# Patient Record
Sex: Female | Born: 1950 | Race: White | Hispanic: No | Smoking: Former smoker
Health system: Southern US, Community
[De-identification: ages and names within clinical notes are randomized; demographics above are authoritative.]

## PROBLEM LIST (undated history)

## (undated) DIAGNOSIS — Z8489 Family history of other specified conditions: Secondary | ICD-10-CM

## (undated) DIAGNOSIS — R351 Nocturia: Secondary | ICD-10-CM

## (undated) DIAGNOSIS — I10 Essential (primary) hypertension: Secondary | ICD-10-CM

## (undated) DIAGNOSIS — K5792 Diverticulitis of intestine, part unspecified, without perforation or abscess without bleeding: Secondary | ICD-10-CM

## (undated) DIAGNOSIS — F419 Anxiety disorder, unspecified: Secondary | ICD-10-CM

## (undated) DIAGNOSIS — D649 Anemia, unspecified: Secondary | ICD-10-CM

## (undated) HISTORY — PX: TUBAL LIGATION: SHX77

## (undated) HISTORY — PX: OTHER SURGICAL HISTORY: SHX169

## (undated) HISTORY — PX: TONSILLECTOMY: SUR1361

## (undated) HISTORY — PX: ABDOMINAL HYSTERECTOMY: SHX81

---

## 1997-04-06 HISTORY — PX: BRAIN AVM REPAIR: SHX202

## 2015-08-05 DIAGNOSIS — K5792 Diverticulitis of intestine, part unspecified, without perforation or abscess without bleeding: Secondary | ICD-10-CM

## 2015-08-05 HISTORY — DX: Diverticulitis of intestine, part unspecified, without perforation or abscess without bleeding: K57.92

## 2015-10-30 ENCOUNTER — Other Ambulatory Visit: Payer: Self-pay | Admitting: General Surgery

## 2015-10-30 DIAGNOSIS — R1904 Left lower quadrant abdominal swelling, mass and lump: Secondary | ICD-10-CM

## 2015-11-01 ENCOUNTER — Ambulatory Visit
Admission: RE | Admit: 2015-11-01 | Discharge: 2015-11-01 | Disposition: A | Discharge status: Home / Self Care | Payer: Self-pay | Source: Ambulatory Visit | Attending: General Surgery | Admitting: General Surgery

## 2015-11-01 ENCOUNTER — Encounter: Payer: Self-pay | Admitting: Radiology

## 2015-11-01 DIAGNOSIS — R1904 Left lower quadrant abdominal swelling, mass and lump: Secondary | ICD-10-CM

## 2015-11-01 MED ORDER — IOPAMIDOL (ISOVUE-300) INJECTION 61%
100.0000 mL | Freq: Once | INTRAVENOUS | Status: AC | PRN
  Administered 2015-11-01: 100 mL via INTRAVENOUS

## 2015-11-03 ENCOUNTER — Inpatient Hospital Stay (HOSPITAL_COMMUNITY)
Admission: AD | Admit: 2015-11-03 | Discharge: 2015-11-06 | DRG: 392 | Disposition: A | Discharge status: Home / Self Care | Payer: BLUE CROSS/BLUE SHIELD | Source: Ambulatory Visit | Attending: General Surgery | Admitting: General Surgery

## 2015-11-03 DIAGNOSIS — N823 Fistula of vagina to large intestine: Secondary | ICD-10-CM | POA: Diagnosis present

## 2015-11-03 DIAGNOSIS — IMO0002 Reserved for concepts with insufficient information to code with codable children: Secondary | ICD-10-CM

## 2015-11-03 DIAGNOSIS — K5780 Diverticulitis of intestine, part unspecified, with perforation and abscess without bleeding: Secondary | ICD-10-CM | POA: Diagnosis present

## 2015-11-03 DIAGNOSIS — R109 Unspecified abdominal pain: Secondary | ICD-10-CM | POA: Diagnosis present

## 2015-11-03 DIAGNOSIS — K651 Peritoneal abscess: Secondary | ICD-10-CM

## 2015-11-03 LAB — COMPREHENSIVE METABOLIC PANEL
ALT: 10 U/L — ABNORMAL LOW (ref 14–54)
AST: 16 U/L (ref 15–41)
Albumin: 3.3 g/dL — ABNORMAL LOW (ref 3.5–5.0)
Alkaline Phosphatase: 69 U/L (ref 38–126)
Anion gap: 10 (ref 5–15)
BILIRUBIN TOTAL: 0.5 mg/dL (ref 0.3–1.2)
BUN: 9 mg/dL (ref 6–20)
CO2: 26 mmol/L (ref 22–32)
CREATININE: 0.56 mg/dL (ref 0.44–1.00)
Calcium: 9.1 mg/dL (ref 8.9–10.3)
Chloride: 99 mmol/L — ABNORMAL LOW (ref 101–111)
Glucose, Bld: 138 mg/dL — ABNORMAL HIGH (ref 65–99)
POTASSIUM: 3.4 mmol/L — AB (ref 3.5–5.1)
Sodium: 135 mmol/L (ref 135–145)
TOTAL PROTEIN: 8.1 g/dL (ref 6.5–8.1)

## 2015-11-03 LAB — CBC WITH DIFFERENTIAL/PLATELET
BASOS ABS: 0 10*3/uL (ref 0.0–0.1)
Basophils Relative: 0 %
Eosinophils Absolute: 0 10*3/uL (ref 0.0–0.7)
Eosinophils Relative: 1 %
HEMATOCRIT: 32.5 % — AB (ref 36.0–46.0)
Hemoglobin: 10.5 g/dL — ABNORMAL LOW (ref 12.0–15.0)
LYMPHS ABS: 1.2 10*3/uL (ref 0.7–4.0)
LYMPHS PCT: 15 %
MCH: 31.8 pg (ref 26.0–34.0)
MCHC: 32.3 g/dL (ref 30.0–36.0)
MCV: 98.5 fL (ref 78.0–100.0)
MONO ABS: 0.6 10*3/uL (ref 0.1–1.0)
MONOS PCT: 8 %
NEUTROS ABS: 6.4 10*3/uL (ref 1.7–7.7)
Neutrophils Relative %: 76 %
Platelets: 595 10*3/uL — ABNORMAL HIGH (ref 150–400)
RBC: 3.3 MIL/uL — ABNORMAL LOW (ref 3.87–5.11)
RDW: 13.6 % (ref 11.5–15.5)
WBC: 8.2 10*3/uL (ref 4.0–10.5)

## 2015-11-03 LAB — PROTIME-INR
INR: 1.18
PROTHROMBIN TIME: 15.1 s (ref 11.4–15.2)

## 2015-11-03 MED ORDER — KCL IN DEXTROSE-NACL 20-5-0.45 MEQ/L-%-% IV SOLN
INTRAVENOUS | Status: DC
  Administered 2015-11-03 – 2015-11-05 (×2): via INTRAVENOUS
  Filled 2015-11-03 (×3): qty 1000

## 2015-11-03 MED ORDER — ACETAMINOPHEN 650 MG RE SUPP: 650.0000 mg | Freq: Four times a day (QID) | RECTAL | Status: DC | PRN

## 2015-11-03 MED ORDER — IBUPROFEN 200 MG PO TABS: 600.0000 mg | ORAL_TABLET | Freq: Four times a day (QID) | ORAL | Status: DC | PRN

## 2015-11-03 MED ORDER — ENOXAPARIN SODIUM 40 MG/0.4ML ~~LOC~~ SOLN
40.0000 mg | SUBCUTANEOUS | Status: DC
  Administered 2015-11-03 – 2015-11-05 (×3): 40 mg via SUBCUTANEOUS
  Filled 2015-11-03 (×5): qty 0.4

## 2015-11-03 MED ORDER — ACETAMINOPHEN 325 MG PO TABS: 650.0000 mg | ORAL_TABLET | Freq: Four times a day (QID) | ORAL | Status: DC | PRN

## 2015-11-03 MED ORDER — ONDANSETRON 4 MG PO TBDP: 4.0000 mg | ORAL_TABLET | Freq: Four times a day (QID) | ORAL | Status: DC | PRN

## 2015-11-03 MED ORDER — OXYCODONE HCL 5 MG PO TABS
5.0000 mg | ORAL_TABLET | ORAL | Status: DC | PRN
  Administered 2015-11-04 (×2): 5 mg via ORAL
  Administered 2015-11-05 (×2): 10 mg via ORAL
  Administered 2015-11-06: 5 mg via ORAL
  Filled 2015-11-03 (×2): qty 1
  Filled 2015-11-03: qty 2
  Filled 2015-11-03 (×3): qty 1

## 2015-11-03 MED ORDER — DEXTROSE 5 % IV SOLN
2.0000 g | INTRAVENOUS | Status: DC
  Administered 2015-11-03 – 2015-11-05 (×3): 2 g via INTRAVENOUS
  Filled 2015-11-03 (×4): qty 2

## 2015-11-03 MED ORDER — METRONIDAZOLE IN NACL 5-0.79 MG/ML-% IV SOLN
500.0000 mg | Freq: Three times a day (TID) | INTRAVENOUS | Status: DC
  Administered 2015-11-03 – 2015-11-06 (×9): 500 mg via INTRAVENOUS
  Filled 2015-11-03 (×10): qty 100

## 2015-11-03 MED ORDER — MORPHINE SULFATE (PF) 2 MG/ML IV SOLN: 2.0000 mg | INTRAVENOUS | Status: DC | PRN

## 2015-11-03 MED ORDER — ENSURE ENLIVE PO LIQD
237.0000 mL | Freq: Two times a day (BID) | ORAL | Status: DC
  Administered 2015-11-05 (×2): 237 mL via ORAL

## 2015-11-03 MED ORDER — ONDANSETRON HCL 4 MG/2ML IJ SOLN: 4.0000 mg | Freq: Four times a day (QID) | INTRAMUSCULAR | Status: DC | PRN

## 2015-11-03 NOTE — H&P (Signed)
Donna Garrett is an 65 y.o. female.   Chief Complaint: abd pian HPI: Pt was seen as an outpt last week.  Her symptoms included abd pain and weight loss as well as stool per vagina.  She was noted to have a mass in LLQ as well.  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  We discussed this over the phone, and I recommended that she come in to the hospital to be evaluated for treatment in IR and antibiotics.    No past medical history on file.  No past surgical history on file.  No family history on file. Social History:  has no tobacco, alcohol, and drug history on file.  Allergies: Allergies not on file  No prescriptions prior to admission.    Results for orders placed or performed during the hospital encounter of 11/03/15 (from the past 48 hour(s))  CBC WITH DIFFERENTIAL     Status: Abnormal   Collection Time: 11/03/15  4:48 PM  Result Value Ref Range   WBC 8.2 4.0 - 10.5 K/uL   RBC 3.30 (L) 3.87 - 5.11 MIL/uL   Hemoglobin 10.5 (L) 12.0 - 15.0 g/dL   HCT 16.1 (L) 09.6 - 04.5 %   MCV 98.5 78.0 - 100.0 fL   MCH 31.8 26.0 - 34.0 pg   MCHC 32.3 30.0 - 36.0 g/dL   RDW 40.9 81.1 - 91.4 %   Platelets 595 (H) 150 - 400 K/uL   Neutrophils Relative % 76 %   Neutro Abs 6.4 1.7 - 7.7 K/uL   Lymphocytes Relative 15 %   Lymphs Abs 1.2 0.7 - 4.0 K/uL   Monocytes Relative 8 %   Monocytes Absolute 0.6 0.1 - 1.0 K/uL   Eosinophils Relative 1 %   Eosinophils Absolute 0.0 0.0 - 0.7 K/uL   Basophils Relative 0 %   Basophils Absolute 0.0 0.0 - 0.1 K/uL  Protime-INR     Status: None   Collection Time: 11/03/15  4:48 PM  Result Value Ref Range   Prothrombin Time 15.1 11.4 - 15.2 seconds   INR 1.18    No results found.  Review of Systems  Constitutional: Positive for malaise/fatigue and weight loss. Negative for chills and fever.  HENT: Negative for hearing loss.   Eyes: Negative for blurred vision and double vision.   Respiratory: Negative for cough, sputum production and shortness of breath.   Cardiovascular: Negative for chest pain and palpitations.  Gastrointestinal: Positive for abdominal pain. Negative for constipation, diarrhea, nausea and vomiting.  Genitourinary: Negative for dysuria, frequency and urgency.  Skin: Negative for itching and rash.  Neurological: Positive for weakness. Negative for dizziness and headaches.    Blood pressure 119/62, pulse 94, temperature 98.6 F (37 C), temperature source Oral, resp. rate 18, SpO2 100 %. Physical Exam  Constitutional: She is oriented to person, place, and time. She appears well-developed and well-nourished. No distress.  HENT:  Head: Normocephalic and atraumatic.  Eyes: Conjunctivae and EOM are normal. Pupils are equal, round, and reactive to light.  Neck: Normal range of motion.  Cardiovascular: Normal rate and regular rhythm.   Respiratory: Effort normal and breath sounds normal. No respiratory distress.  GI: Soft. She exhibits mass (LLQ). She exhibits no distension. There is tenderness. There is no rebound and no guarding.  Musculoskeletal: Normal range of motion.  Neurological: She is alert and oriented to person, place, and time.  Skin: Skin is warm and dry.  She is not diaphoretic.     Assessment/Plan 65 y.o. F with chronic appearing abdominal abscesses on CT.  This appears to be due to diverticular disease.  I would like to have IR evaluate for possible drain placement.  I will make NPO p MN.  Will place on IV antibiotics and get full set of labs to start her workup.   Vanita Panda., MD 11/03/2015, 5:30 PM

## 2015-11-04 ENCOUNTER — Encounter (HOSPITAL_COMMUNITY): Payer: Self-pay

## 2015-11-04 ENCOUNTER — Inpatient Hospital Stay (HOSPITAL_COMMUNITY): Payer: BLUE CROSS/BLUE SHIELD

## 2015-11-04 MED ORDER — MIDAZOLAM HCL 2 MG/2ML IJ SOLN
INTRAMUSCULAR | Status: AC | PRN
  Administered 2015-11-04 (×2): 0.5 mg via INTRAVENOUS
  Administered 2015-11-04: 1 mg via INTRAVENOUS

## 2015-11-04 MED ORDER — FENTANYL CITRATE (PF) 100 MCG/2ML IJ SOLN
INTRAMUSCULAR | Status: AC | PRN
  Administered 2015-11-04: 50 ug via INTRAVENOUS
  Administered 2015-11-04: 25 ug via INTRAVENOUS

## 2015-11-04 MED ORDER — FENTANYL CITRATE (PF) 100 MCG/2ML IJ SOLN
INTRAMUSCULAR | Status: AC
  Filled 2015-11-04: qty 4

## 2015-11-04 MED ORDER — MIDAZOLAM HCL 2 MG/2ML IJ SOLN
INTRAMUSCULAR | Status: AC
  Filled 2015-11-04: qty 4

## 2015-11-04 MED ORDER — ZOLPIDEM TARTRATE 5 MG PO TABS
5.0000 mg | ORAL_TABLET | Freq: Once | ORAL | Status: AC
  Administered 2015-11-04: 5 mg via ORAL
  Filled 2015-11-04: qty 1

## 2015-11-04 NOTE — Procedures (Signed)
Technically successful CT guided placed of a 12 Fr multi-side hole drainage catheter placement into the left lower abdominal quadrant yielding 80 cc of purulent, foul smelling fluid..    All aspirated samples sent to the laboratory for analysis.    EBL: Minimal  No immediate post procedural complications.   Katherina Right, MD Pager #: [phone]

## 2015-11-04 NOTE — H&P (Signed)
Chief Complaint: Patient was seen in consultation today for perc drainage of abdominal abscesses at the request of Dr. Romie Levee  Referring Physician(s): Dr. Romie Levee  Supervising Physician: Simonne Come  Patient Status: Inpatient  History of Present Illness: Donna Garrett is a 65 y.o. 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 She feels a little better on IV abx and IR is asked to eval for percutaneous drainage PMHx, meds, imaging, labs, allergies reviewed. She has been NPO  No past medical history on file.  No past surgical history on file.  Allergies: Review of patient's allergies indicates no known allergies.  Medications:  Current Facility-Administered Medications:  .  acetaminophen (TYLENOL) tablet 650 mg, 650 mg, Oral, Q6H PRN **OR** acetaminophen (TYLENOL) suppository 650 mg, 650 mg, Rectal, Q6H PRN, Romie Levee, MD .  cefTRIAXone (ROCEPHIN) 2 g in dextrose 5 % 50 mL IVPB, 2 g, Intravenous, Q24H, 2 g at 11/03/15 1804 **AND** metroNIDAZOLE (FLAGYL) IVPB 500 mg, 500 mg, Intravenous, Q8H, Romie Levee, MD, 500 mg at 11/04/15 0243 .  dextrose 5 % and 0.45 % NaCl with KCl 20 mEq/L infusion, , Intravenous, Continuous, Romie Levee, MD, Last Rate: 10 mL/hr at 11/04/15 0600 .  enoxaparin (LOVENOX) injection 40 mg, 40 mg, Subcutaneous, Q24H, Romie Levee, MD, 40 mg at 11/03/15 1850 .  feeding supplement (ENSURE ENLIVE) (ENSURE ENLIVE) liquid 237 mL, 237 mL, Oral, BID BM, Romie Levee, MD .  ibuprofen (ADVIL,MOTRIN) tablet 600 mg, 600 mg, Oral, Q6H PRN, Romie Levee, MD .  morphine 2 MG/ML injection 2-4 mg, 2-4 mg, Intravenous, Q3H PRN, Romie Levee, MD .  ondansetron (ZOFRAN-ODT) disintegrating tablet 4 mg, 4 mg, Oral, Q6H PRN **OR** ondansetron (ZOFRAN) injection 4 mg, 4 mg, Intravenous, Q6H PRN, Romie Levee, MD .  oxyCODONE (Oxy IR/ROXICODONE) immediate release tablet 5-10  mg, 5-10 mg, Oral, Q4H PRN, Romie Levee, MD, 5 mg at 11/04/15 1610    No family history on file.  Social History   Social History  . Marital status: Divorced    Spouse name: N/A  . Number of children: N/A  . Years of education: N/A   Social History Main Topics  . Smoking status: Not on file  . Smokeless tobacco: Not on file  . Alcohol use Not on file  . Drug use: Unknown  . Sexual activity: Not on file   Other Topics Concern  . Not on file   Social History Narrative  . No narrative on file     Review of Systems: A 12 point ROS discussed and pertinent positives are indicated in the HPI above.  All other systems are negative.  Review of Systems  Vital Signs: BP 95/64 (BP Location: Right Arm)   Pulse 62   Temp 99 F (37.2 C) (Oral)   Resp 16   Ht 5' 1.5" (1.562 m) Comment: per pt  Wt 130 lb (59 kg) Comment: per pt  SpO2 97%   BMI 24.17 kg/m   Physical Exam  Constitutional: She is oriented to person, place, and time. She appears well-developed and well-nourished. No distress.  HENT:  Head: Normocephalic.  Mouth/Throat: Oropharynx is clear and moist.  Neck: Normal range of motion. No JVD present. No tracheal deviation present.  Cardiovascular: Normal rate, regular rhythm and normal heart sounds.   Pulmonary/Chest: Effort normal and breath sounds normal. No respiratory distress. She has no wheezes. She has no rales.  Abdominal: Soft. She exhibits no distension. There is  tenderness in the left lower quadrant. There is no guarding.  Neurological: She is alert and oriented to person, place, and time.  Skin: Skin is warm and dry.  Psychiatric: She has a normal mood and affect. Judgment normal.    Mallampati Score:  MD Evaluation Airway: WNL Heart: WNL Abdomen: WNL Chest/ Lungs: WNL ASA  Classification: 2 Mallampati/Airway Score: One  Imaging: Ct Abdomen Pelvis W Contrast  Result Date: 11/01/2015 CLINICAL DATA:  65 year old female with abnormal weight  loss of 25 pound since May 2017. Possible left lower quadrant mass. Diarrhea. Nausea. Left lower quadrant abdominal pain. EXAM: CT ABDOMEN AND PELVIS WITH CONTRAST TECHNIQUE: Multidetector CT imaging of the abdomen and pelvis was performed using the standard protocol following bolus administration of intravenous contrast. CONTRAST:  ISOVUE-300 IOPAMIDOL (ISOVUE-300) INJECTION 61% COMPARISON:  No priors. FINDINGS: Creatinine was obtained on site at [facility] at [address] Results: Creatinine 0.5 mg/dL. Lower chest:  Unremarkable. Hepatobiliary: No cystic or solid hepatic lesions. No intra or extrahepatic biliary ductal dilatation. Gallbladder is normal in appearance. Pancreas: No pancreatic mass. No pancreatic ductal dilatation. No pancreatic or peripancreatic fluid or inflammatory changes. Spleen: Unremarkable. Adrenals/Urinary Tract: 2.8 x 2.0 cm left adrenal nodule (image 20 of series 3). Right adrenal gland is normal in appearance. Nonobstructive calculi are present within the left renal collecting system, measuring up to 3 mm in the interpolar region. Sub cm low-attenuation lesion in the interpolar region of the right kidney is too small to definitively characterize,, but statistically likely a cyst. No hydroureteronephrosis. Urinary bladder is nearly completely decompressed, but the wall appears thickened and trabeculated, and there is a small amount of gas non dependently in the anterior aspect of the urinary bladder. Stomach/Bowel: The appearance of the stomach is normal. There is no pathologic dilatation of small bowel or colon. There is extensive colonic wall thickening throughout the sigmoid colon. Some diverticulae are noted throughout this region. In addition, there is evidence of what appear to be contained perforations of the sigmoid colon throughout the anatomic pelvis. The largest of these is located in the left lower quadrant (axial image 54 of series 3 and coronal image 35  of series 601) measuring 5.0 x 6.9 x 6.6 cm. Posterior to this also in the left lower quadrant there is a 5.7 x 4.6 x 6.7 cm rim enhancing collection (axial image 56 of series 3 and coronal image 57 of series 601). Both of these collections appear to contain feculent contents, and they may communicate with one another (image 54 of series 3). Other smaller collections include a 2.8 cm low-attenuation rim enhancing collection to the left of the proximal sigmoid colon (image 62 of series 3), and a complex gas and fluid containing collection cephalad to the distal sigmoid colon (image 57 of series 3) measuring up to 2.8 cm. Importantly, there also appear to be several fistulous tracts in the low anatomic pelvis, most notable for a communication between the distal sigmoid colon and vaginal apex, which is best visualized on sagittal image 77 of series 602, and axial image 67 of series 3. Along the right lateral pelvic sidewall there is a smaller fistulous tract which may communicate with adjacent loops of small bowel, however, this is uncertain. Normal appendix. Vascular/Lymphatic: Aortic atherosclerosis, without evidence of aneurysm or dissection in the abdominal or pelvic vasculature. Multiple borderline enlarged retroperitoneal lymph nodes are nonspecific, but likely reactive. Several prominent nonenlarged mesenteric lymph nodes also noted, also likely reactive. Reproductive: Gas and  oral contrast material is noted within the vagina, compatible with colovaginal fistula. Status post hysterectomy. Ovaries are not confidently identified may be surgically absent or atrophic. Other: Trace volume of ascites.  No pneumoperitoneum. Musculoskeletal: There are no aggressive appearing lytic or blastic lesions noted in the visualized portions of the skeleton. IMPRESSION: 1. Findings, as above, compatible with complicated diverticulitis with 2 large and several smaller diverticular abscesses which appear contained within the  sigmoid mesocolon, and small fistulous tracts in the pelvis, as detailed above, including a colovaginal fistula. Given the presence of a small amount of gas in the urinary bladder, the possibility of a colovesical fistula should also be considered. Surgical consultation is recommended. 2. Extensive colonic wall thickening is noted throughout the sigmoid colon. This is favored to be inflammatory in the setting of what appears to be chronically perforated diverticular disease. Underlying neoplasm is difficult to entirely exclude, however, and attention at time of follow-up surgery or nonemergent colonoscopy is recommended to exclude underlying neoplasm. 3. Large left adrenal nodule measuring 2.8 x 2.0 cm. This is indeterminate, and warrants further evaluation with adrenal protocol CT scan in the near future. This recommendation follows ACR consensus guidelines: Management of Incidental Adrenal Masses: A White Paper of the ACR Incidental Findings Committee. J Am Coll Radiol 2017 (in press). 4. Nonobstructive calculi in the left renal collecting system measuring up to 3 mm. No ureteral stones or findings of urinary tract obstruction are noted at this time. 5. Aortic atherosclerosis. These results were called by telephone at the time of interpretation on 11/01/2015 at 2:35 pm to Dr. Romie Levee, who verbally acknowledged these results. Electronically Signed   By: Trudie Reed M.D.   On: 11/01/2015 14:38   Labs:  CBC:  Recent Labs  11/03/15 1648  WBC 8.2  HGB 10.5*  HCT 32.5*  PLT 595*    COAGS:  Recent Labs  11/03/15 1648  INR 1.18    BMP:  Recent Labs  11/03/15 1648  NA 135  K 3.4*  CL 99*  CO2 26  GLUCOSE 138*  BUN 9  CALCIUM 9.1  CREATININE 0.56  GFRNONAA >60  GFRAA >60    LIVER FUNCTION TESTS:  Recent Labs  11/03/15 1648  BILITOT 0.5  AST 16  ALT 10*  ALKPHOS 69  PROT 8.1  ALBUMIN 3.3*    TUMOR MARKERS: No results for input(s): AFPTM, CEA, CA199, CHROMGRNA  in the last 8760 hours.  Assessment and Plan: Diverticulitis with multiple abscesses and colovesical/colovaginal fistula Plan for perc drainage, most likely x 2, of intra-abdominal abscesses. Labs ok Risks and Benefits discussed with the patient including bleeding, infection, damage to adjacent structures, bowel perforation/fistula connection, and sepsis. All of the patient's questions were answered, patient is agreeable to proceed. Consent signed and in chart.    Thank you for this interesting consult.  I greatly enjoyed meeting Donna Garrett and look forward to participating in their care.  A copy of this report was sent to the requesting provider on this date.  Electronically Signed: Brayton El 11/04/2015, 9:31 AM   I spent a total of 20 minutes in face to face in clinical consultation, greater than 50% of which was counseling/coordinating care for perc drainage of abdominal abscesses

## 2015-11-04 NOTE — Progress Notes (Signed)
MEDICATION RELATED CONSULT NOTE   IR Procedure Consult - Anticoagulant/Antiplatelet PTA/Inpatient Med List Review by Pharmacist    Procedure: CT guided percutaneous drainage    Completed: 7/31 @ 1538  Post-Procedural bleeding risk per IR MD assessment:  standard  Antithrombotic medications on inpatient or PTA profile prior to procedure:     Lovenox 40mg   Recommended restart time per IR Post-Procedure Guidelines:   At least 4 hours post-procedure  Other considerations:    RN has already given Lovenox dose x1 post-procedure prior to pharmacy being alerted of this consult.  No bleeding noted.  Plan:     Continue Lovenox 40mg  Monitor for s/sx of bleeding  Junita Push, PharmD, BCPS Pager: [phone] 11/04/2015@8 :48 PM

## 2015-11-04 NOTE — Progress Notes (Signed)
Initial Nutrition Assessment  DOCUMENTATION CODES:   Not applicable  INTERVENTION:  -Continue Ensure Enlive po BID, each supplement provides 350 kcal and 20 grams of protein -RD to continue to monitor  NUTRITION DIAGNOSIS:   Inadequate oral intake related to poor appetite as evidenced by per patient/family report.  GOAL:   Patient will meet greater than or equal to 90% of their needs  MONITOR:   PO intake, I & O's, Labs, Weight trends  REASON FOR ASSESSMENT:   Malnutrition Screening Tool    ASSESSMENT:   Donna Garrett is a 65 y.o. 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 Pt states that she has lost 25# in 3 months. Stated weight loss would exhibit 25#/16% severe wt loss in 3 months. She also stated she was "very hungry" Was unable to retrieve diet hx due to pt going to radiology, will retrieve at follow-up. She does not have a weight hx in the chart. Will monitor for diet advancement and PO intake Unable to complete Nutrition-Focused physical exam at this time.   Labs and medications reviewed: K 3.4 Versed, Fentanyl D5 @ 79mL/hr --> 41 calories  Diet Order:  Diet NPO time specified  Skin:  Reviewed, no issues  Last BM:  PTA  Height:   Ht Readings from Last 1 Encounters:  11/03/15 5' 1.5" (1.562 m)    Weight:   Wt Readings from Last 1 Encounters:  11/03/15 130 lb (59 kg)    Ideal Body Weight:  48.86 kg  BMI:  Body mass index is 24.17 kg/m.  Estimated Nutritional Needs:   Kcal:  1200-1450 calories  Protein:  60-70 grams  Fluid:  >/= 1.2L  EDUCATION NEEDS:   No education needs identified at this time  Dionne Ano. Shanisha Lech, MS, RD LDN Inpatient Clinical  Dietitian Pager [phone]

## 2015-11-04 NOTE — Progress Notes (Signed)
Intra-abdominal abscess (HCC)  Subjective: Pt did well overnight  Objective: Vital signs in last 24 hours: Temp:  [98.6 F (37 C)-99 F (37.2 C)] 99 F (37.2 C) (07/31 0540) Pulse Rate:  [62-94] 62 (07/31 0540) Resp:  [16-18] 16 (07/31 0540) BP: (95-119)/(52-64) 95/64 (07/31 0540) SpO2:  [97 %-100 %] 97 % (07/31 0540) Weight:  [59 kg (130 lb)] 59 kg (130 lb) (07/30 1650)    Intake/Output from previous day: 07/30 0701 - 07/31 0700 In: 70 [I.V.:70] Out: 900 [Urine:900] Intake/Output this shift: Total I/O In: 70 [I.V.:70] Out: 900 [Urine:900]  General appearance: alert and cooperative GI: abd soft, LLQ mass the same  Lab Results:  Results for orders placed or performed during the hospital encounter of 11/03/15 (from the past 24 hour(s))  CBC WITH DIFFERENTIAL     Status: Abnormal   Collection Time: 11/03/15  4:48 PM  Result Value Ref Range   WBC 8.2 4.0 - 10.5 K/uL   RBC 3.30 (L) 3.87 - 5.11 MIL/uL   Hemoglobin 10.5 (L) 12.0 - 15.0 g/dL   HCT 15.4 (L) 00.8 - 67.6 %   MCV 98.5 78.0 - 100.0 fL   MCH 31.8 26.0 - 34.0 pg   MCHC 32.3 30.0 - 36.0 g/dL   RDW 19.5 09.3 - 26.7 %   Platelets 595 (H) 150 - 400 K/uL   Neutrophils Relative % 76 %   Neutro Abs 6.4 1.7 - 7.7 K/uL   Lymphocytes Relative 15 %   Lymphs Abs 1.2 0.7 - 4.0 K/uL   Monocytes Relative 8 %   Monocytes Absolute 0.6 0.1 - 1.0 K/uL   Eosinophils Relative 1 %   Eosinophils Absolute 0.0 0.0 - 0.7 K/uL   Basophils Relative 0 %   Basophils Absolute 0.0 0.0 - 0.1 K/uL  Comprehensive metabolic panel     Status: Abnormal   Collection Time: 11/03/15  4:48 PM  Result Value Ref Range   Sodium 135 135 - 145 mmol/L   Potassium 3.4 (L) 3.5 - 5.1 mmol/L   Chloride 99 (L) 101 - 111 mmol/L   CO2 26 22 - 32 mmol/L   Glucose, Bld 138 (H) 65 - 99 mg/dL   BUN 9 6 - 20 mg/dL   Creatinine, Ser 1.24 0.44 - 1.00 mg/dL   Calcium 9.1 8.9 - 58.0 mg/dL   Total Protein 8.1 6.5 - 8.1 g/dL   Albumin 3.3 (L) 3.5 - 5.0 g/dL   AST  16 15 - 41 U/L   ALT 10 (L) 14 - 54 U/L   Alkaline Phosphatase 69 38 - 126 U/L   Total Bilirubin 0.5 0.3 - 1.2 mg/dL   GFR calc non Af Amer >60 >60 mL/min   GFR calc Af Amer >60 >60 mL/min   Anion gap 10 5 - 15  Protime-INR     Status: None   Collection Time: 11/03/15  4:48 PM  Result Value Ref Range   Prothrombin Time 15.1 11.4 - 15.2 seconds   INR 1.18      Studies/Results Radiology     MEDS, Scheduled . cefTRIAXone (ROCEPHIN)  IV  2 g Intravenous Q24H   And  . metronidazole  500 mg Intravenous Q8H  . enoxaparin (LOVENOX) injection  40 mg Subcutaneous Q24H  . feeding supplement (ENSURE ENLIVE)  237 mL Oral BID BM     Assessment: Intra-abdominal abscess (HCC) Colovaginal fistula  Plan: Will discuss with IR today re: drainage catheter placement.  Cont IV antibiotics.  LOS: 1 day    Vanita Panda, MD [facility], [place] [phone]   11/04/2015 6:45 AM

## 2015-11-05 HISTORY — PX: OTHER SURGICAL HISTORY: SHX169

## 2015-11-05 MED ORDER — ZOLPIDEM TARTRATE 5 MG PO TABS
5.0000 mg | ORAL_TABLET | Freq: Every evening | ORAL | Status: DC | PRN
Start: 2015-11-05 — End: 2015-11-06
  Administered 2015-11-05: 5 mg via ORAL
  Filled 2015-11-05: qty 1

## 2015-11-05 NOTE — Progress Notes (Signed)
Patient ID: Donna Garrett, female   DOB: 12/25/1950, 65 y.o.   MRN: [id number]    Referring Physician(s): Thomas,A  Supervising Physician: Richarda Overlie  Patient Status:  Inpatient  Chief Complaint:  Diverticular abscess  Subjective:  Pt feeling much better following pelvic abscess drainage yesterday; anticipates d/c home tomorrow   Allergies: Review of patient's allergies indicates no known allergies.  Medications: Prior to Admission medications   Medication Sig Start Date End Date Taking? Authorizing Provider  acetaminophen (TYLENOL) 500 MG tablet Take 500-1,000 mg by mouth every 6 (six) hours as needed for mild pain or headache.   Yes Historical Provider, MD  cetirizine (ZYRTEC) 10 MG tablet Take 10 mg by mouth daily as needed for allergies.   Yes Historical Provider, MD  lisinopril (PRINIVIL,ZESTRIL) 10 MG tablet Take 10 mg by mouth daily as needed (high blood pressure).  09/13/15  Yes Historical Provider, MD  Melatonin 3 MG TABS Take 3 mg by mouth daily as needed (sleep).   Yes Historical Provider, MD     Vital Signs: BP 113/60 (BP Location: Right Arm)   Pulse 61   Temp 98.3 F (36.8 C) (Oral)   Resp 16   Ht 5' 1.5" (1.562 m) Comment: per pt  Wt 130 lb (59 kg) Comment: per pt  SpO2 100%   BMI 24.17 kg/m   Physical Exam awake/alert; LLQ drain intact, about 100 cc turbid reddish- brown fluid with air in bag; cx's pend  Imaging: Ct Image Guided Drainage By Percutaneous Catheter  Result Date: 11/04/2015 INDICATION: History of perforated diverticulitis. Please perform CT-guided drain placement for infection source control. EXAM: CT IMAGE GUIDED DRAINAGE BY PERCUTANEOUS CATHETER COMPARISON:  CT abdomen pelvis - 11/01/2015 MEDICATIONS: The patient is currently admitted to the hospital and receiving intravenous antibiotics. The antibiotics were administered within an appropriate time frame prior to the initiation of the procedure. ANESTHESIA/SEDATION: Moderate (conscious)  sedation was employed during this procedure. A total of Versed 2 mg and Fentanyl 100 mcg was administered intravenously. Moderate Sedation Time: 25 minutes. The patient's level of consciousness and vital signs were monitored continuously by radiology nursing throughout the procedure under my direct supervision. CONTRAST:  None COMPLICATIONS: None immediate. PROCEDURE: Informed written consent was obtained from the patient after a discussion of the risks, benefits and alternatives to treatment. The patient was placed supine on the CT gantry and a pre procedural CT was performed re-demonstrating the known bilobed abscess/fluid collections within the left lower abdominal quadrant with dominant the deep component measuring approximately 4.9 x 6.1 cm (image 16, series 2) and more superficial air in fluid contain though less well-defined component measuring approximately 4.4 x 2.9 cm (image 15). The procedure was planned. A timeout was performed prior to the initiation of the procedure. The skin overlying the left lower lateral abdomen was prepped and draped in the usual sterile fashion. The overlying soft tissues were anesthetized with 1% lidocaine with epinephrine. Appropriate trajectory was planned with the use of a 22 gauge spinal needle. An 18 gauge trocar needle was advanced through the superficial component of the bilobed abscess with tip terminating within the deeper component of the complex fluid collection. Appropriate positioning was confirmed and a short Amplatz super stiff wire was coiled within the collection. Appropriate positioning was confirmed with a limited CT scan. The tract was serially dilated allowing placement of a 12 French biliary drainage multipurpose catheter. Appropriate positioning was confirmed with sideholes of the drainage catheter extending from the deeper component to the peripheral  aspect of the superficial component with a limited postprocedural CT scan. Following drainage catheter  placement, approximately 80 Ml of purulent, foul smelling fluid was aspirated. The tube was connected to a drainage bag and sutured in place. A dressing was placed. The patient tolerated the procedure well without immediate post procedural complication. IMPRESSION: Successful CT guided placement of a 40 French biliary drainage drain catheter into the bilobed abscess within the left lower abdominal quadrant with sideholes extending from the deep component to the peripheral aspect of the superficial component ultimately yielding aspiration of approximately 80 mL of purulent fluid. Samples were sent to the laboratory as requested by the ordering clinical team. Electronically Signed   By: Simonne Come M.D.   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    Electronically Signed: D. Jeananne Rama 11/05/2015, 2:54 PM   I spent a total of 15 minutes at the the patient's bedside AND on the patient's hospital floor or unit, greater than 50% of which was counseling/coordinating care for pelvic abscess drain

## 2015-11-05 NOTE — Progress Notes (Signed)
Intra-abdominal abscess (HCC)  Subjective: Pt did well after CT drain yesterday.  No major events.  Starting to feel better already  Objective: Vital signs in last 24 hours: Temp:  [97.5 F (36.4 C)-99.1 F (37.3 C)] 98.7 F (37.1 C) (08/01 0630) Pulse Rate:  [56-78] 60 (08/01 0630) Resp:  [11-18] 16 (08/01 0630) BP: (84-107)/(37-67) 107/67 (08/01 0630) SpO2:  [96 %-100 %] 98 % (08/01 0630) Last BM Date: 11/04/15  Intake/Output from previous day: 07/31 0701 - 08/01 0700 In: 447.2 [P.O.:120; I.V.:227.2; IV Piggyback:100] Out: 700 [Urine:700] Intake/Output this shift: No intake/output data recorded.  General appearance: alert and cooperative GI: abd soft, LLQ mass the same  Lab Results:  Results for orders placed or performed during the hospital encounter of 11/03/15 (from the past 24 hour(s))  Aerobic/Anaerobic Culture (surgical/deep wound)     Status: None (Preliminary result)   Collection Time: 11/04/15  4:00 PM  Result Value Ref Range   Specimen Description ABSCESS DIVERTICULAR ABSCESS    Special Requests NONE    Gram Stain      ABUNDANT WBC PRESENT, PREDOMINANTLY PMN ABUNDANT GRAM POSITIVE COCCI IN PAIRS IN CHAINS RARE GRAM POSITIVE RODS Performed at [facility]    Culture PENDING    Report Status PENDING      Studies/Results Radiology     MEDS, Scheduled . cefTRIAXone (ROCEPHIN)  IV  2 g Intravenous Q24H   And  . metronidazole  500 mg Intravenous Q8H  . enoxaparin (LOVENOX) injection  40 mg Subcutaneous Q24H  . feeding supplement (ENSURE ENLIVE)  237 mL Oral BID BM     Assessment: Intra-abdominal abscess (HCC) Colovaginal fistula  Plan: Reg diet today Will switch to PO abx tom Plan for d/c tom    LOS: 2 days    Vanita Panda, MD [facility], [place] [phone]   11/05/2015 8:29 AM

## 2015-11-06 ENCOUNTER — Other Ambulatory Visit: Payer: Self-pay | Admitting: Radiology

## 2015-11-06 DIAGNOSIS — N739 Female pelvic inflammatory disease, unspecified: Secondary | ICD-10-CM

## 2015-11-06 LAB — AEROBIC/ANAEROBIC CULTURE W GRAM STAIN (SURGICAL/DEEP WOUND)

## 2015-11-06 LAB — CBC
HCT: 28.9 % — ABNORMAL LOW (ref 36.0–46.0)
HEMOGLOBIN: 9.3 g/dL — AB (ref 12.0–15.0)
MCH: 31.5 pg (ref 26.0–34.0)
MCHC: 32.2 g/dL (ref 30.0–36.0)
MCV: 98 fL (ref 78.0–100.0)
PLATELETS: 501 10*3/uL — AB (ref 150–400)
RBC: 2.95 MIL/uL — ABNORMAL LOW (ref 3.87–5.11)
RDW: 13.8 % (ref 11.5–15.5)
WBC: 6.7 10*3/uL (ref 4.0–10.5)

## 2015-11-06 LAB — AEROBIC/ANAEROBIC CULTURE (SURGICAL/DEEP WOUND)

## 2015-11-06 MED ORDER — OXYCODONE HCL 5 MG PO TABS: 5.0000 mg | ORAL_TABLET | ORAL | 0 refills | Status: DC | PRN

## 2015-11-06 MED ORDER — METRONIDAZOLE 500 MG PO TABS: 500.0000 mg | ORAL_TABLET | Freq: Three times a day (TID) | ORAL | 0 refills | Status: DC

## 2015-11-06 MED ORDER — AMOXICILLIN-POT CLAVULANATE 875-125 MG PO TABS: 1.0000 | ORAL_TABLET | Freq: Two times a day (BID) | ORAL | 0 refills | Status: DC

## 2015-11-06 NOTE — Discharge Summary (Signed)
Physician Discharge Summary  Patient ID: Donna Garrett MRN: [id number] DOB/AGE: [birth year] 64 y.o.  Admit date: 11/03/2015 Discharge date: 11/06/2015  Admission Diagnoses: Intra-abdominal abscess  Discharge Diagnoses:  Principal Problem:   Intra-abdominal abscess (HCC) Colovaginal fistula  Discharged Condition: good  Hospital Course: Patient admitted with large pericolonic abscess.  She was placed on antibiotics and IR was consulted with drain placement.  This occurred without difficulty.  She has now been stable for 24 hrs post procedure and is ready for discharge.  Consults: IR  Significant Diagnostic Studies: labs: cbc, chemistry  Treatments: CT guided drain  Discharge Exam: Blood pressure 113/71, pulse (!) 107, temperature 98.2 F (36.8 C), temperature source Oral, resp. rate 15, height 5' 1.5" (1.562 m), weight 59 kg (130 lb), SpO2 100 %. General appearance: alert and cooperative GI: soft, non-tender Drain: purulent output noted  Disposition: Final discharge disposition not confirmed    Follow-up Information    Vanita Panda., MD. Schedule an appointment as soon as possible for a visit in 2 week(s).   Specialty:  General Surgery Contact information: [address] [place] [address] [phone]           Signed: Vanita Panda 11/06/2015, 10:06 AM

## 2015-11-06 NOTE — Progress Notes (Signed)
Pt discharged to home with sister and other family member at bedside.  Vitals stable. Denies pain.  Dressing Clean dry and intact.  Discussed discharge instructions.  Verbalized understanding.  Pt provided with 2 weeks of supplies of dressing changes, flushes, and measuring cup for drain.  Pt demonstrated proper technique with flushing 5 ml saline into drain.  Pt verbalized understanding of dressing changes and stated she performed a dressing change this morning with night RN.  Pt demonstrated how to empty and measure drain amount.  No other concerns at this time.  Barrie Lyme RN 2:03 PM 11/06/2015

## 2015-11-06 NOTE — Discharge Instructions (Addendum)
Percutaneous Abscess Drain, Care After °Refer to this sheet in the next few weeks. These instructions provide you with information on caring for yourself after your procedure. Your health care provider may also give you more specific instructions. Your treatment has been planned according to current medical practices, but problems sometimes occur. Call your health care provider if you have any problems or questions after your procedure. °WHAT TO EXPECT AFTER THE PROCEDURE °After your procedure, it is typical to have the following:  °· A small amount of discomfort in the area where the drainage tube was placed. °· A small amount of bruising around the area where the drainage tube was placed. °· Sleepiness and fatigue for the rest of the day from the medicines used. °HOME CARE INSTRUCTIONS °· Rest at home for 1-2 days following your procedure or as directed by your health care provider. °· If you go home right after the procedure, plan to have someone with you for 24 hours. °· Do not take a bath or shower for 24 hours after your procedure. °· Take medicines only as directed by your health care provider. Ask your health care provider when you can resume taking any normal medicines. °· Change bandages (dressings) as directed.   °· You may be told to record the amount of drainage from the bag every time you empty it. Follow your health care provider's directions for emptying the bag. Write down the amount of drainage, the date, and the time you emptied it. °· Call your health care provider when the drain is putting out less than 10 mL of drainage per day for 2-3 days in a row or as directed by your health care provider. °· Follow your health care provider's instructions for cleaning the drainage tube. You may need to clean the tube every day so that it does not clog. °SEEK MEDICAL CARE IF: °· You have increased bleeding (more than a small spot) from the site where the drainage tube was placed. °· You have redness,  swelling, or increasing pain around the site where the drainage tube was placed. °· You notice a discharge or bad smell coming from the site where the drainage tube was placed. °· You have a fever or chills.  °· You have pain that is not helped by medicine.   °SEEK IMMEDIATE MEDICAL CARE IF: °· There is leakage around the drainage tube. °· The drainage tube pulls out. °· You suddenly stop having drainage from the tube. °· You suddenly have blood in the drainage fluid. °· You become dizzy or faint. °· You develop a rash.   °· You have nausea or vomiting. °· You have difficulty breathing, feel short of breath, or feel faint.   °· You develop chest pain. °· You have problems with your speech or vision. °· You have trouble balancing or moving your arms or legs. °  °This information is not intended to replace advice given to you by your health care provider. Make sure you discuss any questions you have with your health care provider. °  °Document Released: 08/07/2013 Document Revised: 01/09/2014 Document Reviewed: 08/07/2013 °[employer] Interactive Patient Education ©2016 [employer] ° °

## 2015-11-08 ENCOUNTER — Other Ambulatory Visit: Payer: Self-pay | Admitting: General Surgery

## 2015-11-08 DIAGNOSIS — N739 Female pelvic inflammatory disease, unspecified: Secondary | ICD-10-CM

## 2015-11-14 ENCOUNTER — Other Ambulatory Visit: Payer: Self-pay | Admitting: General Surgery

## 2015-11-14 ENCOUNTER — Ambulatory Visit
Admission: RE | Admit: 2015-11-14 | Discharge: 2015-11-14 | Disposition: A | Discharge status: Home / Self Care | Payer: BLUE CROSS/BLUE SHIELD | Source: Ambulatory Visit | Attending: Radiology | Admitting: Radiology

## 2015-11-14 ENCOUNTER — Other Ambulatory Visit (HOSPITAL_COMMUNITY): Payer: Self-pay | Admitting: Interventional Radiology

## 2015-11-14 ENCOUNTER — Ambulatory Visit
Admission: RE | Admit: 2015-11-14 | Discharge: 2015-11-14 | Disposition: A | Discharge status: Home / Self Care | Payer: BLUE CROSS/BLUE SHIELD | Source: Ambulatory Visit | Attending: General Surgery | Admitting: General Surgery

## 2015-11-14 DIAGNOSIS — N739 Female pelvic inflammatory disease, unspecified: Secondary | ICD-10-CM

## 2015-11-14 DIAGNOSIS — K5720 Diverticulitis of large intestine with perforation and abscess without bleeding: Secondary | ICD-10-CM

## 2015-11-14 HISTORY — PX: IR GENERIC HISTORICAL: IMG1180011

## 2015-11-14 MED ORDER — IOPAMIDOL (ISOVUE-300) INJECTION 61%
100.0000 mL | Freq: Once | INTRAVENOUS | Status: AC | PRN
  Administered 2015-11-14: 100 mL via INTRAVENOUS

## 2015-11-14 NOTE — Progress Notes (Signed)
Patient ID: Donna Garrett, female   DOB: [birth year], 65 y.o.   MRN: [id number]        Chief Complaint: Diverticular abscess drainage catheter management.  Referring Physician(s): Thomas,Alicia  History of Present Illness: Donna Garrett is a 65 y.o. female without any significant past medical history who was found to have complex diverticular abscess on CT scan of the abdomen and pelvis performed 11/01/2015 for the evaluation of abdominal pain and weight loss. For this, the patient underwent successful CT-guided percutaneous drainage catheter placement on 11/04/2015 (note, a biliary drainage catheter was utilized in lieu of a standard drainage catheter secondary to the elongated nature of the dominant abscess).  The patient presents to the interventional radiology drain clinic for percutaneous drainage catheter evaluation and management. She is unaccompanied and serves as her own historian.  The patient reports near complete resolution of her per preprocedural abdominal pain following percutaneous drainage catheter placement. She has completed her course of oral antibiotics last evening. She denies fever or chills.  The patient states she continues to flush the percutaneous drainage catheter twice a day. She reports continued output from the percutaneous drainage catheter ranging from 40-50 mL per day. Additionally, she reports a minimal amount of daily vaginal discharge.  She is otherwise without complaint.   No past medical history on file.  No past surgical history on file.  Allergies: Review of patient's allergies indicates no known allergies.  Medications: Prior to Admission medications   Medication Sig Start Date End Date Taking? Authorizing Provider  acetaminophen (TYLENOL) 500 MG tablet Take 500-1,000 mg by mouth every 6 (six) hours as needed for mild pain or headache.    Historical Provider, MD  amoxicillin-clavulanate (AUGMENTIN) 875-125 MG tablet Take 1 tablet by mouth 2 (two) times  daily. 11/06/15   Romie Levee, MD  cetirizine (ZYRTEC) 10 MG tablet Take 10 mg by mouth daily as needed for allergies.    Historical Provider, MD  lisinopril (PRINIVIL,ZESTRIL) 10 MG tablet Take 10 mg by mouth daily as needed (high blood pressure).  09/13/15   Historical Provider, MD  Melatonin 3 MG TABS Take 3 mg by mouth daily as needed (sleep).    Historical Provider, MD  metroNIDAZOLE (FLAGYL) 500 MG tablet Take 1 tablet (500 mg total) by mouth 3 (three) times daily. 11/06/15   Romie Levee, MD  oxyCODONE (OXY IR/ROXICODONE) 5 MG immediate release tablet Take 1-2 tablets (5-10 mg total) by mouth every 4 (four) hours as needed for moderate pain. 11/06/15   Romie Levee, MD     No family history on file.  Social History   Social History  . Marital status: Divorced    Spouse name: N/A  . Number of children: N/A  . Years of education: N/A   Social History Main Topics  . Smoking status: Former Smoker    Quit date: 11/04/1978  . Smokeless tobacco: Never Used  . Alcohol use Not on file  . Drug use: Unknown  . Sexual activity: Not on file   Other Topics Concern  . Not on file   Social History Narrative  . No narrative on file    ECOG Status: 1 - Symptomatic but completely ambulatory  Review of Systems: A 12 point ROS discussed and pertinent positives are indicated in the HPI above.  All other systems are negative.  Review of Systems  Constitutional: Negative for activity change and appetite change.  Respiratory: Negative.   Cardiovascular: Negative.   Gastrointestinal: Negative for abdominal pain.  Genitourinary:  Positive for vaginal discharge. Negative for flank pain and hematuria.    Vital Signs: There were no vitals taken for this visit.  Physical Exam  Constitutional: She appears well-developed and well-nourished.  Musculoskeletal:       Arms: Location of drainage catheter.  Nursing note and vitals reviewed.   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 Patient underwent CT-guided percutaneous drainage catheter placement into bilobed diverticular abscess on 11/04/2015. She presents to the Interventional Radiology drain Clinic for postprocedural CT scan of the abdomen pelvis as well as percutaneous drainage catheter evaluation and management. Patient reports near complete resolution of her preprocedural left lower quadrant abdominal pain. Patient continues to report output from the percutaneous drainage catheter on the order of 30-40 cc per day. She continues to flush the percutaneous drainage catheter twice a day. Additionally, the patient reports persistent daily vaginal spotting. The patient completed her course of oral antibiotics yesterday. No fever or chills EXAM: CT ABDOMEN AND PELVIS WITH CONTRAST TECHNIQUE: Multidetector CT imaging of the abdomen and pelvis was performed using the standard protocol following bolus administration of intravenous contrast. CONTRAST:  ISOVUE-300 IOPAMIDOL (ISOVUE-300) INJECTION 61% COMPARISON:  None. FINDINGS: Lower chest: Limited visualization of lower thorax demonstrates minimal dependent subpleural ground-glass atelectasis. No focal airspace opacities. No pleural effusion. Borderline cardiomegaly.  No pericardial effusion. Hepatobiliary: Normal hepatic contour. No discrete hepatic lesions. Normal appearance of the gallbladder given underdistention. No radiopaque gallstones. No intra extrahepatic bili duct dilatation. No ascites. Pancreas: Normal appearance of the pancreas Spleen: Normal appearance of the spleen Adrenals/Urinary Tract: There is symmetric enhancement of the bilateral kidneys. Note is made of 2 punctate (approximately 2 mm) nonobstructing left-sided renal stones. No definite right-sided renal stones. Sub cm hypo attenuating right-sided renal lesions are too small to adequately  characterize of favored to represent renal cysts. No discrete renal lesions. No urinary obstruction or perinephric stranding. Note is made of an approximately 1.8 x 2.0 cm nodule within in the medial limb of the left adrenal gland, unchanged since the 10/2015 examination. Normal appearance of the right adrenal gland. There is mild thickening of the urinary bladder wall, potentially accentuated due to underdistention. Stomach/Bowel: There has been marked reduction of previously noted bilobed diverticular abscess following percutaneous drainage catheter placement. There is a small (approximately 3.1 x 2.0 by 2.6 cm air and fluid containing collection surrounding the peripheral aspect of the percutaneous drainage catheter (axial image 53, series 3, coronal image 41, series 601). A very small amount of fluid surrounds the end coil of the percutaneous drainage catheter with dominant component about the anterior aspect of the end of the drainage catheter measuring approximately 1.8 x 2.5 x 2.9 cm (axial image 62, series 3; coronal image 46, series 601). 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 No new definable/drainable abdominal or pelvic fluid collections. Normal appearance of the terminal ileum and appendix. No pneumoperitoneum, pneumatosis or portal venous gas. Vascular/Lymphatic: Moderate amount of mixed calcified and noncalcified atherosclerotic plaque within a normal caliber abdominal aorta. The major branch vessels of the abdominal aorta appear patent on this non CTA examination. Reproductive: Post hysterectomy. As above, there is tethering of the anterior aspect of the rectum to the vaginal cuff at the location of the previously suspected  colovesicular low fistula. Trace amount of free fluid in the pelvic cul-de-sac (image 64, series 3).  Other: Regional soft tissues appear normal. Musculoskeletal: No acute or aggressive osseous abnormalities. Moderate severe DDD of L5-S1 with disc space height loss, endplate irregularity and sclerosis. Degenerative change of the pubic symphysis. IMPRESSION: 1. Marked reduction of bilobed diverticular abscess following percutaneous drainage catheter placement. Small fluid collections remaining about the end and peripheral aspect of the percutaneous drainage catheter measuring approximately 2.5 and 3.1 cm in dimensions respectively. No new definable / drainable intra-abdominal or pelvic fluid collections. 2. 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 3. Punctate nonobstructing left-sided nephrolithiasis. 4.  Aortic Atherosclerosis (ICD10-170.0) 5. **An incidental finding of potential clinical significance has been found. Indeterminate approximately 2.8 cm left adrenal gland nodule, unchanged since the 10/2015 examination. Will coordinate for the patient's next drainage catheter evaluations CT scan to be scheduled as a adrenal protocol CT scan - tentatively technically scheduled for 8/29.** Electronically Signed   By: Simonne Come M.D.   On: 11/14/2015 14:00   Ct Abdomen Pelvis W Contrast  Result Date: 11/01/2015 CLINICAL DATA:  65 year old female with abnormal weight loss of 25 pound since May 2017. Possible left lower quadrant mass. Diarrhea. Nausea. Left lower quadrant abdominal pain. EXAM: CT ABDOMEN AND PELVIS WITH CONTRAST TECHNIQUE: Multidetector CT imaging of the abdomen and pelvis was performed using the standard protocol following bolus administration of intravenous contrast. CONTRAST:  ISOVUE-300 IOPAMIDOL (ISOVUE-300) INJECTION 61% COMPARISON:  No priors. FINDINGS: Creatinine was obtained on site at [facility] at [address]  Results: Creatinine 0.5 mg/dL. Lower chest:  Unremarkable. Hepatobiliary: No cystic or solid hepatic lesions. No intra or extrahepatic biliary ductal dilatation. Gallbladder is normal in appearance. Pancreas: No pancreatic mass. No pancreatic ductal dilatation. No pancreatic or peripancreatic fluid or inflammatory changes. Spleen: Unremarkable. Adrenals/Urinary Tract: 2.8 x 2.0 cm left adrenal nodule (image 20 of series 3). Right adrenal gland is normal in appearance. Nonobstructive calculi are present within the left renal collecting system, measuring up to 3 mm in the interpolar region. Sub cm low-attenuation lesion in the interpolar region of the right kidney is too small to definitively characterize,, but statistically likely a cyst. No hydroureteronephrosis. Urinary bladder is nearly completely decompressed, but the wall appears thickened and trabeculated, and there is a small amount of gas non dependently in the anterior aspect of the urinary bladder. Stomach/Bowel: The appearance of the stomach is normal. There is no pathologic dilatation of small bowel or colon. There is extensive colonic wall thickening throughout the sigmoid colon. Some diverticulae are noted throughout this region. In addition, there is evidence of what appear to be contained perforations of the sigmoid colon throughout the anatomic pelvis. The largest of these is located in the left lower quadrant (axial image 54 of series 3 and coronal image 35 of series 601) measuring 5.0 x 6.9 x 6.6 cm. Posterior to this also in the left lower quadrant there is a 5.7 x 4.6 x 6.7 cm rim enhancing collection (axial image 56 of series 3 and coronal image 57 of series 601). Both of these collections appear to contain feculent contents, and they may communicate with one another (image 54 of series 3). Other smaller collections include a 2.8 cm low-attenuation rim enhancing collection to the left of the proximal sigmoid colon (image 62 of series 3), and a  complex gas and fluid containing collection cephalad to the distal sigmoid colon (image 57 of series 3) measuring up to 2.8 cm. Importantly, there also appear to be several fistulous tracts in the low anatomic pelvis, most notable for a communication between the distal sigmoid colon and vaginal apex, which is best visualized on sagittal image 77 of series 602, and axial image 67 of series 3. Along the right lateral pelvic sidewall there is a smaller fistulous tract which may communicate with adjacent loops of small bowel, however, this is uncertain. Normal appendix. Vascular/Lymphatic: Aortic atherosclerosis, without evidence of aneurysm or dissection in the abdominal or pelvic vasculature. Multiple borderline enlarged retroperitoneal lymph nodes are nonspecific, but likely reactive. Several prominent nonenlarged mesenteric lymph nodes also noted, also likely reactive. Reproductive: Gas and oral contrast material is noted within the vagina, compatible with colovaginal fistula. Status post hysterectomy. Ovaries are not confidently identified may be surgically absent or atrophic. Other: Trace volume of ascites.  No pneumoperitoneum. Musculoskeletal: There are no aggressive appearing lytic or blastic lesions noted in the visualized portions of the skeleton. IMPRESSION: 1. Findings, as above, compatible with complicated diverticulitis with 2 large and several smaller diverticular abscesses which appear contained within the sigmoid mesocolon, and small fistulous tracts in the pelvis, as detailed above, including a colovaginal fistula. Given the presence of a small amount of gas in the urinary bladder, the possibility of a colovesical fistula should also be considered. Surgical consultation is recommended. 2. Extensive colonic wall thickening is noted throughout the sigmoid colon. This is favored to be inflammatory in the setting of what appears to be chronically perforated diverticular disease. Underlying neoplasm is  difficult to entirely exclude, however, and attention at time of follow-up surgery or nonemergent colonoscopy is recommended to exclude underlying neoplasm. 3. Large left adrenal nodule measuring 2.8 x 2.0 cm. This is indeterminate, and warrants further evaluation with adrenal protocol CT scan in the near future. This recommendation follows ACR consensus guidelines: Management of Incidental Adrenal Masses: A White Paper of the ACR Incidental Findings Committee. J Am Coll Radiol 2017 (in press). 4. Nonobstructive calculi in the left renal collecting system measuring up to 3 mm. No ureteral stones or findings of urinary tract obstruction are noted at this time. 5. Aortic atherosclerosis. These results were called by telephone at the time of interpretation on 11/01/2015 at 2:35 pm to Dr. Romie Levee, who verbally acknowledged these results. Electronically Signed   By: Trudie Reed M.D.   On: 11/01/2015 14:38  Ct Image Guided Drainage By Percutaneous Catheter  Result Date: 11/04/2015 INDICATION: History of perforated diverticulitis. Please perform CT-guided drain placement for infection source control. EXAM: CT IMAGE GUIDED DRAINAGE BY PERCUTANEOUS CATHETER COMPARISON:  CT abdomen pelvis - 11/01/2015 MEDICATIONS: The patient is currently admitted to the hospital and receiving intravenous antibiotics. The antibiotics were administered within an appropriate time frame prior to the initiation of the procedure. ANESTHESIA/SEDATION: Moderate (conscious) sedation was employed during this procedure. A total of Versed 2 mg and Fentanyl 100 mcg was administered intravenously. Moderate Sedation Time: 25 minutes. The patient's level of consciousness and vital signs were monitored continuously by radiology nursing throughout the procedure under my direct supervision. CONTRAST:  None COMPLICATIONS: None immediate. PROCEDURE: Informed written consent was obtained from the patient after a discussion of the risks, benefits and  alternatives to treatment. The patient was placed supine on the CT gantry and a pre procedural CT was performed re-demonstrating the known bilobed abscess/fluid collections within the left lower abdominal  quadrant with dominant the deep component measuring approximately 4.9 x 6.1 cm (image 16, series 2) and more superficial air in fluid contain though less well-defined component measuring approximately 4.4 x 2.9 cm (image 15). The procedure was planned. A timeout was performed prior to the initiation of the procedure. The skin overlying the left lower lateral abdomen was prepped and draped in the usual sterile fashion. The overlying soft tissues were anesthetized with 1% lidocaine with epinephrine. Appropriate trajectory was planned with the use of a 22 gauge spinal needle. An 18 gauge trocar needle was advanced through the superficial component of the bilobed abscess with tip terminating within the deeper component of the complex fluid collection. Appropriate positioning was confirmed and a short Amplatz super stiff wire was coiled within the collection. Appropriate positioning was confirmed with a limited CT scan. The tract was serially dilated allowing placement of a 12 French biliary drainage multipurpose catheter. Appropriate positioning was confirmed with sideholes of the drainage catheter extending from the deeper component to the peripheral aspect of the superficial component with a limited postprocedural CT scan. Following drainage catheter placement, approximately 80 Ml of purulent, foul smelling fluid was aspirated. The tube was connected to a drainage bag and sutured in place. A dressing was placed. The patient tolerated the procedure well without immediate post procedural complication. IMPRESSION: Successful CT guided placement of a 7312 French biliary drainage drain catheter into the bilobed abscess within the left lower abdominal quadrant with sideholes extending from the deep component to the  peripheral aspect of the superficial component ultimately yielding aspiration of approximately 80 mL of purulent fluid. Samples were sent to the laboratory as requested by the ordering clinical team. Electronically Signed   By: Simonne ComeJohn  Oral Remache M.D.   On: 11/04/2015 16:23    Labs:  CBC:  Recent Labs  11/03/15 1648 11/06/15 0457  WBC 8.2 6.7  HGB 10.5* 9.3*  HCT 32.5* 28.9*  PLT 595* 501*    COAGS:  Recent Labs  11/03/15 1648  INR 1.18    BMP:  Recent Labs  11/03/15 1648  NA 135  K 3.4*  CL 99*  CO2 26  GLUCOSE 138*  BUN 9  CALCIUM 9.1  CREATININE 0.56  GFRNONAA >60  GFRAA >60    LIVER FUNCTION TESTS:  Recent Labs  11/03/15 1648  BILITOT 0.5  AST 16  ALT 10*  ALKPHOS 69  PROT 8.1  ALBUMIN 3.3*   Assessment and Plan:  Lupe CarneyWanda Dowler is a 65 y.o. female without any significant past medical history who was found to have complex diverticular abscess for which the patient underwent successful CT-guided percutaneous drainage catheter placement on 11/04/2015 (note, a biliary drainage catheter was utilized in lieu of a standard drainage catheter secondary to the elongated nature of the dominant abscess).    Symptomatically, the patient is much improved following percutaneous drainage catheter placement with resolution of her preprocedural abdominal pain. She has completed her course of oral antibiotics and currently denies fever or chills.  Review of CT scan performed earlier today demonstrates near complete resolution of large bilobed abdominal/pelvic abscess with 2 tiny (approximate 2.5 and 3.1 cm) residual fluid collections both of which appeared adequately drained by the existing percutaneous drainage catheter.  Subsequent fluoroscopic guided drainage catheter injection and demonstrated persistent communication with the decompressed abscess cavity and the adjacent sigmoid colon.  Above findings were discussed with referring surgeon, Dr. Maisie Fushomas and the following plan  was formulated:  - The patient will be scheduled  for fluoroscopic guided conversion of existing biliary drainage catheter to a standard all purpose drainage catheter on 11/25/2015 (the patient is unavailable for fluoroscopic guided drainage catheter exchange tomorrow and will be on vacation the following week).  - The patient will return to the interventional radiology drain clinic on 8/29 for repeat CT scan and potential drainage catheter injection (note, the CT scan will be protocoled as an adrenal lesion protocol examination (given the presence of an indeterminate approximately 2.0 cm nodule within the left adrenal gland) and will be augmented with enteric contrast to evaluate for a persistent colovaginal fistula.  - The patient will not be restarted on oral antibiotics at this time.  The patient was encouraged to call the interventional radiology drain clinic with any interval questions or concerns.  A copy of this report was sent to the requesting provider on this date.  Electronically Signed: Simonne Come 11/14/2015, 2:04 PM   I spent a total of 15 Minutes in face to face in clinical consultation, greater than 50% of which was counseling/coordinating care for diverticular abscess drainage catheter management

## 2015-11-25 ENCOUNTER — Other Ambulatory Visit: Payer: Self-pay | Admitting: General Surgery

## 2015-11-26 ENCOUNTER — Other Ambulatory Visit (HOSPITAL_COMMUNITY): Payer: BLUE CROSS/BLUE SHIELD

## 2015-11-26 ENCOUNTER — Ambulatory Visit (HOSPITAL_COMMUNITY)
Admission: RE | Admit: 2015-11-26 | Discharge: 2015-11-26 | Disposition: A | Discharge status: Home / Self Care | Payer: BLUE CROSS/BLUE SHIELD | Source: Ambulatory Visit | Attending: Interventional Radiology | Admitting: Interventional Radiology

## 2015-11-26 ENCOUNTER — Other Ambulatory Visit (HOSPITAL_COMMUNITY): Payer: Self-pay | Admitting: Interventional Radiology

## 2015-11-26 ENCOUNTER — Ambulatory Visit (HOSPITAL_COMMUNITY): Admission: RE | Admit: 2015-11-26 | Payer: BLUE CROSS/BLUE SHIELD | Source: Ambulatory Visit

## 2015-11-26 ENCOUNTER — Encounter (HOSPITAL_COMMUNITY): Payer: Self-pay | Admitting: Interventional Radiology

## 2015-11-26 DIAGNOSIS — N739 Female pelvic inflammatory disease, unspecified: Secondary | ICD-10-CM

## 2015-11-26 DIAGNOSIS — Z4803 Encounter for change or removal of drains: Secondary | ICD-10-CM | POA: Insufficient documentation

## 2015-11-26 HISTORY — PX: IR GENERIC HISTORICAL: IMG1180011

## 2015-11-26 MED ORDER — LIDOCAINE HCL 1 % IJ SOLN
INTRAMUSCULAR | Status: AC
  Filled 2015-11-26: qty 20

## 2015-11-26 MED ORDER — IOPAMIDOL (ISOVUE-300) INJECTION 61%: 10.0000 mL | Freq: Once | INTRAVENOUS | Status: DC | PRN

## 2015-11-26 NOTE — Procedures (Signed)
Drain injection today shows resolved fistula to sigmoid colon.  Abscess drain removed  D/w Dr. Maisie Fushomas

## 2015-12-03 ENCOUNTER — Other Ambulatory Visit: Payer: BLUE CROSS/BLUE SHIELD

## 2015-12-04 ENCOUNTER — Other Ambulatory Visit: Payer: Self-pay | Admitting: General Surgery

## 2015-12-23 ENCOUNTER — Encounter (HOSPITAL_COMMUNITY): Payer: Self-pay | Admitting: *Deleted

## 2015-12-26 ENCOUNTER — Encounter (HOSPITAL_COMMUNITY)
Admission: RE | Disposition: A | Discharge status: Home / Self Care | Payer: Self-pay | Source: Ambulatory Visit | Attending: General Surgery

## 2015-12-26 ENCOUNTER — Ambulatory Visit (HOSPITAL_COMMUNITY): Payer: BLUE CROSS/BLUE SHIELD | Admitting: Anesthesiology

## 2015-12-26 ENCOUNTER — Ambulatory Visit (HOSPITAL_COMMUNITY)
Admission: RE | Admit: 2015-12-26 | Discharge: 2015-12-26 | Disposition: A | Discharge status: Home / Self Care | Payer: BLUE CROSS/BLUE SHIELD | Source: Ambulatory Visit | Attending: General Surgery | Admitting: General Surgery

## 2015-12-26 ENCOUNTER — Encounter (HOSPITAL_COMMUNITY): Payer: Self-pay | Admitting: *Deleted

## 2015-12-26 DIAGNOSIS — I10 Essential (primary) hypertension: Secondary | ICD-10-CM | POA: Diagnosis not present

## 2015-12-26 DIAGNOSIS — K5669 Other intestinal obstruction: Secondary | ICD-10-CM | POA: Insufficient documentation

## 2015-12-26 DIAGNOSIS — Z1211 Encounter for screening for malignant neoplasm of colon: Secondary | ICD-10-CM | POA: Diagnosis present

## 2015-12-26 DIAGNOSIS — K621 Rectal polyp: Secondary | ICD-10-CM | POA: Diagnosis not present

## 2015-12-26 DIAGNOSIS — Z87891 Personal history of nicotine dependence: Secondary | ICD-10-CM | POA: Diagnosis not present

## 2015-12-26 HISTORY — DX: Diverticulitis of intestine, part unspecified, without perforation or abscess without bleeding: K57.92

## 2015-12-26 HISTORY — DX: Essential (primary) hypertension: I10

## 2015-12-26 HISTORY — PX: FLEXIBLE SIGMOIDOSCOPY: SHX5431

## 2015-12-26 HISTORY — DX: Anemia, unspecified: D64.9

## 2015-12-26 HISTORY — DX: Family history of other specified conditions: Z84.89

## 2015-12-26 HISTORY — DX: Nocturia: R35.1

## 2015-12-26 SURGERY — SIGMOIDOSCOPY, FLEXIBLE
Anesthesia: Monitor Anesthesia Care

## 2015-12-26 MED ORDER — PROPOFOL 500 MG/50ML IV EMUL
INTRAVENOUS | Status: DC | PRN
Start: 2015-12-26 — End: 2015-12-26
  Administered 2015-12-26: 40 mg via INTRAVENOUS
  Administered 2015-12-26: 20 mg via INTRAVENOUS

## 2015-12-26 MED ORDER — PROPOFOL 500 MG/50ML IV EMUL
INTRAVENOUS | Status: DC | PRN
  Administered 2015-12-26: 100 ug/kg/min via INTRAVENOUS

## 2015-12-26 MED ORDER — SODIUM CHLORIDE 0.9 % IV SOLN: INTRAVENOUS | Status: DC

## 2015-12-26 MED ORDER — LACTATED RINGERS IV SOLN
INTRAVENOUS | Status: DC
  Administered 2015-12-26: 1000 mL via INTRAVENOUS

## 2015-12-26 MED ORDER — PROPOFOL 10 MG/ML IV BOLUS
INTRAVENOUS | Status: AC
  Filled 2015-12-26: qty 40

## 2015-12-26 NOTE — Transfer of Care (Signed)
Immediate Anesthesia Transfer of Care Note  Patient: Donna Garrett  Procedure(s) Performed: Procedure(s): FLEXIBLE SIGMOIDOSCOPY  Patient Location: PACU  Anesthesia Type:MAC  Level of Consciousness: awake, alert  and oriented  Airway & Oxygen Therapy: Patient Spontanous Breathing and Patient connected to face mask oxygen  Post-op Assessment: Report given to RN and Post -op Vital signs reviewed and stable  Post vital signs: Reviewed and stable  Last Vitals:  Vitals:   12/26/15 0817  BP: (!) 125/57  Resp: 11  Temp: 36.4 C    Last Pain:  Vitals:   12/26/15 0817  TempSrc: Oral         Complications: No apparent anesthesia complications

## 2015-12-26 NOTE — Discharge Instructions (Addendum)
Post Colonoscopy Instructions ° °1. DIET: Follow a light bland diet the first 24 hours after arrival home, such as soup, liquids, crackers, etc.  Be sure to include lots of fluids daily.  Avoid fast food or heavy meals as your are more likely to get nauseated.   °2. You may have some mild rectal bleeding for the first few days after the procedure.  This should get less and less with time.  Resume any blood thinners 2 days after your procedure unless directed otherwise by your physician. °3. Take your usually prescribed home medications unless otherwise directed. °a. If you have any pain, it is helpful to get up and walk around, as it is usually from excess gas. °b. If this is not helpful, you can take an over-the-counter pain medication.  Choose one of the following that works best for you: °i. Naproxen (Aleve, etc)  Two 220mg tabs twice a day °ii. Ibuprofen (Advil, etc) Three 200mg tabs four times a day (every meal & bedtime) °iii. If you still have pain after using one of these, please call the office °4. It is normal to not have a bowel movement for 2-3 days after colonoscopy.   ° °5. ACTIVITIES as tolerated:   °6. You may resume regular (light) daily activities beginning the next day--such as daily self-care, walking, climbing stairs--gradually increasing activities as tolerated.  ° ° °WHEN TO CALL US [phone]: °1. Fever over 101.5 F (38.5 C)  °2. Severe abdominal or chest pain  °3. Large amount of rectal bleeding, passing multiple blood clots  °4. Dizziness or shortness of breath °5. Increasing nausea or vomiting ° ° The clinic staff is available to answer your questions during regular business hours (8:30am-5pm).  Please don’t hesitate to call and ask to speak to one of our nurses for clinical concerns.  ° If you have a medical emergency, go to the nearest emergency room or call 911. ° A surgeon from [facility] is always on call at the hospitals ° ° °[facility] °[address], [address], [place], [place]  [address] ? °MAIN: [phone] ? TOLL FREE: [phone] ?  °FAX [phone] °[email or web address] ° ° °

## 2015-12-26 NOTE — Op Note (Signed)
[facility] Patient Name: Donna Garrett Procedure Date: 12/26/2015 MRN: [id number] Attending MD: Romie Levee , MD Date of Birth: [birth year] CSN: [id number] Age: 65 Admit Type: Outpatient Procedure:                Flexible sigmoidoscopy Indications:              Screening for colorectal malignant neoplasm,                            Incidental - Abdominal pain in the left lower                            quadrant, Incidental - Abnormal CT of the GI tract Providers:                Romie Levee, MD, Janae Sauce. Steele Berg, RN, Lorenda Ishihara,                            Technician, Roni Bread, CRNA Referring MD:              Medicines:                Propofol per Anesthesia, Monitored Anesthesia Care Complications:            No immediate complications. Estimated blood loss:                            Minimal. Estimated Blood Loss:     Estimated blood loss was minimal. Procedure:                Pre-Anesthesia Assessment:                           - Prior to the procedure, a History and Physical                            was performed, and patient medications and                            allergies were reviewed. The patient's tolerance of                            previous anesthesia was also reviewed. The risks                            and benefits of the procedure and the sedation                            options and risks were discussed with the patient.                            All questions were answered, and informed consent                            was obtained. Prior Anticoagulants: The patient has  taken no previous anticoagulant or antiplatelet                            agents. ASA Grade Assessment: II - A patient with                            mild systemic disease. After reviewing the risks                            and benefits, the patient was deemed in                            satisfactory condition to undergo the  procedure.                           After obtaining informed consent, the colonoscope                            was passed under direct vision. Throughout the                            procedure, the patient's blood pressure, pulse, and                            oxygen saturations were monitored continuously. The                            procedure was aborted. The colonscope was not                            inserted. Medications were given. The colonoscopy                            was performed without difficulty. The patient                            tolerated the procedure well. The quality of the                            bowel preparation was excellent. No anatomical                            landmarks were photographed. Scope In: 8:58:26 AM Scope Out: 9:10:24 AM Total Procedure Duration: 0 hours 11 minutes 58 seconds  Findings:      The perianal and digital rectal examinations were normal.      A 10 mm polyp was found in the rectum (benign-appearing lesion). The       polyp was pedunculated. The polyp was removed with a cold snare.       Resection and retrieval were complete.      An extrinsic severe stenosis measuring 5 mm (inner diameter) was found       in the distal sigmoid colon and was non-traversed. Impression:               - One benign  appearing 10 mm polyp in the rectum,                            removed with a cold snare. Resected and retrieved.                           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 Moderate Sedation:      Moderate (conscious) sedation was personally administered by an       anesthesia professional. The following parameters were monitored: oxygen       saturation, heart rate, blood pressure, and response to care. Recommendation:           - Discharge patient to home (ambulatory).                           - Low fiber diet today.                           - Miralax 1 capful (17 grams) in 8 ounces of water                             PO PRN.                           - Repeat colonoscopy in 6 weeks for surveillance                            based on pathology results.                           - Continue present medications. Procedure Code(s):         Diagnosis Code(s):        --- Professional ---                           Z12.11, Encounter for screening for malignant                            neoplasm of colon                           K56.69, Other intestinal obstruction                           K62.1, Rectal polyp CPT copyright 2016 [employer]. All rights reserved. The codes documented in this report are preliminary and upon coder review may  be revised to meet current compliance requirements. Romie LeveeAlicia Kayne Yuhas, MD Romie LeveeAlicia Fergie Sherbert, MD 12/26/2015 9:25:36 AM This report has been signed electronically. Number of Addenda: 0

## 2015-12-26 NOTE — H&P (Signed)
  HPI: Pt was seen in the office 2 months ago.  Her symptoms included abd pain and weight loss as well as stool per vagina.  She was noted to have a mass in LLQ as well.  She underwent a CT guided drain placement.  Her abscess has now resolved.  She is here for screening colonoscopy.    Past Medical History:  Diagnosis Date  . Anemia 40 yrs ago  . Diverticulitis 08/2015  . Family history of adverse reaction to anesthesia    mother hx of ponv  . Hypertension    no bp meds for last 2 months since 31 pound weight loss  . Nocturia      Past Surgical History:  Procedure Laterality Date  . ABDOMINAL HYSTERECTOMY  age 65   partial  . BRAIN AVM REPAIR  1999  . IR GENERIC HISTORICAL  11/26/2015   IR SINUS/FIST TUBE CHK-NON GI 11/26/2015 Berdine DanceMichael Shick, MD [facility]-INTERV RAD  . left ear surgery to remove bone stainles teel inserted  age 65  . left side tube inserted into mass of side infection for diverticulitis  11/2015  . TONSILLECTOMY  as chikl  . TUBAL LIGATION       History reviewed. No pertinent family history.  Social History:  has no tobacco, alcohol, and drug history on file.  No Known Allergies  No prescriptions prior to admission.    Review of Systems  Constitutional: Positive for malaise/fatigue and weight loss. Negative for chills and fever.  HENT: Negative for hearing loss.   Eyes: Negative for blurred vision and double vision.  Respiratory: Negative for cough, sputum production and shortness of breath.   Cardiovascular: Negative for chest pain and palpitations.  Gastrointestinal: Positive for abdominal pain. Negative for constipation, diarrhea, nausea and vomiting.  Genitourinary: Negative for dysuria, frequency and urgency.  Skin: Negative for itching and rash.  Neurological: Positive for weakness. Negative for dizziness and headaches.    There were no vitals taken for this visit.   Physical Exam  Constitutional: She is oriented to person, place, and time. She  appears well-developed and well-nourished. No distress.  HENT:  Head: Normocephalic and atraumatic.  Eyes: Conjunctivae and EOM are normal. Pupils are equal, round, and reactive to light.  Neck: Normal range of motion.  Cardiovascular: Normal rate and regular rhythm.   Respiratory: Effort normal and breath sounds normal. No respiratory distress.  GI: Soft.  She exhibits no distension. No tenderness. There is no rebound and no guarding.  Musculoskeletal: Normal range of motion.  Neurological: She is alert and oriented to person, place, and time.  Skin: Skin is warm and dry. She is not diaphoretic.     Assessment/Plan 65 y.o. F with a h/o chronic appearing abdominal abscesses on CT and IR drainage.  Now here for colonoscopy.  Risks include bleeding, perforation, missed pathology and inability to complete the procedure.  Vanita PandaHOMAS, Cedarius Kersh C., MD 11/03/2015, 5:30 PM

## 2015-12-26 NOTE — Anesthesia Preprocedure Evaluation (Addendum)
Anesthesia Evaluation  Patient identified by MRN, date of birth, ID band Patient awake    Reviewed: Allergy & Precautions, NPO status , Patient's Chart, lab work & pertinent test results  Airway Mallampati: II  TM Distance: >3 FB     Dental  (+) Dental Advisory Given   Pulmonary former smoker,    breath sounds clear to auscultation       Cardiovascular hypertension,  Rhythm:Regular Rate:Normal     Neuro/Psych negative neurological ROS     GI/Hepatic Neg liver ROS, Hx of diverticulitis   Endo/Other  negative endocrine ROS  Renal/GU negative Renal ROS     Musculoskeletal   Abdominal   Peds  Hematology negative hematology ROS (+)   Anesthesia Other Findings   Reproductive/Obstetrics                            Anesthesia Physical Anesthesia Plan  ASA: II  Anesthesia Plan: MAC   Post-op Pain Management:    Induction: Intravenous  Airway Management Planned: Natural Airway and Nasal Cannula  Additional Equipment:   Intra-op Plan:   Post-operative Plan:   Informed Consent: I have reviewed the patients History and Physical, chart, labs and discussed the procedure including the risks, benefits and alternatives for the proposed anesthesia with the patient or authorized representative who has indicated his/her understanding and acceptance.   Dental advisory given  Plan Discussed with: CRNA  Anesthesia Plan Comments:         Anesthesia Quick Evaluation

## 2015-12-26 NOTE — Anesthesia Postprocedure Evaluation (Signed)
Anesthesia Post Note  Patient: Donna Garrett  Procedure(s) Performed: Procedure(s): FLEXIBLE SIGMOIDOSCOPY  Patient location during evaluation: PACU Anesthesia Type: MAC Level of consciousness: awake and alert Pain management: pain level controlled Vital Signs Assessment: post-procedure vital signs reviewed and stable Respiratory status: spontaneous breathing, nonlabored ventilation, respiratory function stable and patient connected to nasal cannula oxygen Cardiovascular status: stable and blood pressure returned to baseline Anesthetic complications: no    Last Vitals:  Vitals:   12/26/15 0940 12/26/15 0950  BP: 119/62 (!) 129/59  Pulse: (!) 54 (!) 57  Resp: 15 12  Temp:      Last Pain:  Vitals:   12/26/15 0919  TempSrc: Napoleon Formral                 Katti Pelle E

## 2015-12-27 ENCOUNTER — Encounter (HOSPITAL_COMMUNITY): Payer: Self-pay | Admitting: General Surgery

## 2016-01-13 ENCOUNTER — Other Ambulatory Visit: Payer: Self-pay | Admitting: General Surgery

## 2016-01-17 ENCOUNTER — Other Ambulatory Visit: Payer: Self-pay | Admitting: Urology

## 2016-01-28 NOTE — Patient Instructions (Signed)
Donna Garrett  01/28/2016   Your procedure is scheduled on: 02/07/16  [facility]  [place] to 3rd floor to  Short Stay Center at  0515 AM.  Call this number if you have problems the morning of surgery [phone]   Remember: ONLY 1 PERSON MAY GO WITH YOU TO SHORT STAY TO GET  READY MORNING OF YOUR SURGERY.  Do not TAKE ANYTHING BY MOUITH :After Midnight. FOLLOW BOWEL PREP INSTRUCTION AS PER OFFICE--BE SURE TO INCREASE FLUID INTAKE THE DAY OF BOWEL PREP TO PREVENT DEHYDRATION    Take these medicines the morning of surgery with A SIP OF WATER: NONE DO NOT TAKE ANY DIABETIC MEDICATIONS DAY OF YOUR SURGERY                               You may not have any metal on your body including hair pins and              piercings  Do not wear jewelry, make-up, lotions, powders or perfumes, deodorant             Do not wear nail polish.  Do not shave  48 hours prior to surgery.              Men may shave face and neck.   Do not bring valuables to the hospital. [place] IS NOT             RESPONSIBLE   FOR VALUABLES.  Contacts, dentures or bridgework may not be worn into surgery.  Leave suitcase in the car. After surgery it may be brought to your room.                 Please read over the following fact sheets you were given: _____________________________________________________________________             [facility] - Preparing [facility] Before surgery, you can play an important role.  Because skin is not sterile, your skin needs to be as free of germs as possible.  You can reduce the number of germs on your skin by washing with CHG (chlorahexidine gluconate) soap before surgery.  CHG is an antiseptic cleaner which kills germs and bonds with the skin to continue killing germs even after washing. Please DO NOT use if you have an allergy to CHG or antibacterial soaps.  If your skin becomes reddened/irritated stop using the CHG and  inform your nurse when you arrive at Short Stay. Do not shave (including legs and underarms) for at least 48 hours prior to the first CHG shower.  You may shave your face/neck. Please follow these instructions carefully:  1.  Shower with CHG Soap the night before surgery and the  morning of Surgery.  2.  If you choose to wash your hair, wash your hair first as usual with your  normal  shampoo.  3.  After you shampoo, rinse your hair and body thoroughly to remove the  shampoo.                           4.  Use CHG as you would any other liquid soap.  You can apply chg directly  to the skin and wash  Gently with a scrungie or clean washcloth.  5.  Apply the CHG Soap to your body ONLY FROM THE NECK DOWN.   Do not use on face/ open                           Wound or open sores. Avoid contact with eyes, ears mouth and genitals (private parts).                       Wash face,  Genitals (private parts) with your normal soap.             6.  Wash thoroughly, paying special attention to the area where your surgery  will be performed.  7.  Thoroughly rinse your body with warm water from the neck down.  8.  DO NOT shower/wash with your normal soap after using and rinsing off  the CHG Soap.                9.  Pat yourself dry with a clean towel.            10.  Wear clean pajamas.            11.  Place clean sheets on your bed the night of your first shower and do not  sleep with pets. Day of Surgery : Do not apply any lotions/deodorants the morning of surgery.  Please wear clean clothes to the hospital/surgery center.  FAILURE TO FOLLOW THESE INSTRUCTIONS MAY RESULT IN THE CANCELLATION OF YOUR SURGERY PATIENT SIGNATURE_________________________________  NURSE SIGNATURE__________________________________  ________________________________________________________________________    CLEAR LIQUID DIET   Foods Allowed                                                                      Foods Excluded  Coffee and tea, regular and decaf                             liquids that you cannot  Plain Jell-O in any flavor                                             see through such as: Fruit ices (not with fruit pulp)                                     milk, soups, orange juice  Iced Popsicles                                    All solid food Carbonated beverages, regular and diet                                    Cranberry, grape and apple juices Sports drinks like Gatorade Lightly seasoned clear broth or consume(fat free) Sugar, honey syrup  Sample Menu Breakfast                                Lunch                                     Supper Cranberry juice                    Beef broth                            Chicken broth Jell-O                                     Grape juice                           Apple juice Coffee or tea                        Jell-O                                      Popsicle                                                Coffee or tea                        Coffee or tea  _____________________________________________________________________

## 2016-01-29 ENCOUNTER — Encounter (HOSPITAL_COMMUNITY)
Admission: RE | Admit: 2016-01-29 | Discharge: 2016-01-29 | Disposition: A | Discharge status: Home / Self Care | Payer: BLUE CROSS/BLUE SHIELD | Source: Ambulatory Visit | Attending: General Surgery | Admitting: General Surgery

## 2016-01-29 ENCOUNTER — Encounter (HOSPITAL_COMMUNITY): Payer: Self-pay

## 2016-01-29 DIAGNOSIS — Z01818 Encounter for other preprocedural examination: Secondary | ICD-10-CM | POA: Diagnosis present

## 2016-01-29 DIAGNOSIS — I10 Essential (primary) hypertension: Secondary | ICD-10-CM | POA: Diagnosis not present

## 2016-01-29 HISTORY — DX: Anxiety disorder, unspecified: F41.9

## 2016-01-29 LAB — BASIC METABOLIC PANEL
Anion gap: 7 (ref 5–15)
BUN: 10 mg/dL (ref 6–20)
CALCIUM: 9.1 mg/dL (ref 8.9–10.3)
CHLORIDE: 102 mmol/L (ref 101–111)
CO2: 26 mmol/L (ref 22–32)
CREATININE: 0.57 mg/dL (ref 0.44–1.00)
GFR calc Af Amer: >60 mL/min (ref >60)
GFR calc non Af Amer: >60 mL/min (ref >60)
Glucose, Bld: 101 mg/dL — ABNORMAL HIGH (ref 65–99)
Potassium: 3.5 mmol/L (ref 3.5–5.1)
Sodium: 135 mmol/L (ref 135–145)

## 2016-01-29 LAB — CBC
HEMATOCRIT: 38.6 % (ref 36.0–46.0)
HEMOGLOBIN: 12.3 g/dL (ref 12.0–15.0)
MCH: 31.6 pg (ref 26.0–34.0)
MCHC: 31.9 g/dL (ref 30.0–36.0)
MCV: 99.2 fL (ref 78.0–100.0)
Platelets: 297 10*3/uL (ref 150–400)
RBC: 3.89 MIL/uL (ref 3.87–5.11)
RDW: 15.5 % (ref 11.5–15.5)
WBC: 7 10*3/uL (ref 4.0–10.5)

## 2016-01-29 LAB — ABO/RH: ABO/RH(D): O NEG

## 2016-01-30 LAB — HEMOGLOBIN A1C
Hgb A1c MFr Bld: 4.8 % (ref 4.8–5.6)
Mean Plasma Glucose: 91 mg/dL

## 2016-02-07 ENCOUNTER — Inpatient Hospital Stay (HOSPITAL_COMMUNITY): Payer: BLUE CROSS/BLUE SHIELD

## 2016-02-07 ENCOUNTER — Inpatient Hospital Stay (HOSPITAL_COMMUNITY): Payer: BLUE CROSS/BLUE SHIELD | Admitting: Registered Nurse

## 2016-02-07 ENCOUNTER — Inpatient Hospital Stay (HOSPITAL_COMMUNITY)
Admission: RE | Admit: 2016-02-07 | Discharge: 2016-02-10 | DRG: 336 | Disposition: A | Discharge status: Home / Self Care | Payer: BLUE CROSS/BLUE SHIELD | Source: Ambulatory Visit | Attending: General Surgery | Admitting: General Surgery

## 2016-02-07 ENCOUNTER — Encounter (HOSPITAL_COMMUNITY)
Admission: RE | Disposition: A | Discharge status: Home / Self Care | Payer: Self-pay | Source: Ambulatory Visit | Attending: General Surgery

## 2016-02-07 ENCOUNTER — Encounter (HOSPITAL_COMMUNITY): Payer: Self-pay | Admitting: *Deleted

## 2016-02-07 DIAGNOSIS — I10 Essential (primary) hypertension: Secondary | ICD-10-CM | POA: Diagnosis present

## 2016-02-07 DIAGNOSIS — K660 Peritoneal adhesions (postprocedural) (postinfection): Secondary | ICD-10-CM | POA: Diagnosis present

## 2016-02-07 DIAGNOSIS — N823 Fistula of vagina to large intestine: Secondary | ICD-10-CM | POA: Diagnosis present

## 2016-02-07 DIAGNOSIS — Z419 Encounter for procedure for purposes other than remedying health state, unspecified: Secondary | ICD-10-CM

## 2016-02-07 DIAGNOSIS — Z87891 Personal history of nicotine dependence: Secondary | ICD-10-CM | POA: Diagnosis not present

## 2016-02-07 DIAGNOSIS — K5720 Diverticulitis of large intestine with perforation and abscess without bleeding: Principal | ICD-10-CM | POA: Diagnosis present

## 2016-02-07 DIAGNOSIS — K632 Fistula of intestine: Secondary | ICD-10-CM | POA: Diagnosis present

## 2016-02-07 DIAGNOSIS — K5790 Diverticulosis of intestine, part unspecified, without perforation or abscess without bleeding: Secondary | ICD-10-CM | POA: Diagnosis present

## 2016-02-07 HISTORY — PX: CYSTOSCOPY WITH STENT PLACEMENT: SHX5790

## 2016-02-07 LAB — TYPE AND SCREEN
ABO/RH(D): O NEG
Antibody Screen: NEGATIVE

## 2016-02-07 SURGERY — COLECTOMY, PARTIAL, ROBOT-ASSISTED, LAPAROSCOPIC
Anesthesia: General | Site: Ureter

## 2016-02-07 MED ORDER — EPHEDRINE SULFATE 50 MG/ML IJ SOLN
INTRAMUSCULAR | Status: DC | PRN
  Administered 2016-02-07: 5 mg via INTRAVENOUS

## 2016-02-07 MED ORDER — ALUM & MAG HYDROXIDE-SIMETH 200-200-20 MG/5ML PO SUSP: 30.0000 mL | Freq: Four times a day (QID) | ORAL | Status: DC | PRN

## 2016-02-07 MED ORDER — FLUORESCEIN SODIUM 10 % IV SOLN
INTRAVENOUS | Status: AC
  Filled 2016-02-07: qty 5

## 2016-02-07 MED ORDER — ONDANSETRON HCL 4 MG/2ML IJ SOLN: 4.0000 mg | Freq: Four times a day (QID) | INTRAMUSCULAR | Status: DC | PRN

## 2016-02-07 MED ORDER — MEPERIDINE HCL 50 MG/ML IJ SOLN: 6.2500 mg | INTRAMUSCULAR | Status: DC | PRN

## 2016-02-07 MED ORDER — LIDOCAINE 2% (20 MG/ML) 5 ML SYRINGE
INTRAMUSCULAR | Status: AC
  Filled 2016-02-07: qty 5

## 2016-02-07 MED ORDER — ALVIMOPAN 12 MG PO CAPS
12.0000 mg | ORAL_CAPSULE | Freq: Once | ORAL | Status: AC
  Administered 2016-02-07: 12 mg via ORAL
  Filled 2016-02-07: qty 1

## 2016-02-07 MED ORDER — ROCURONIUM BROMIDE 10 MG/ML (PF) SYRINGE
PREFILLED_SYRINGE | INTRAVENOUS | Status: DC | PRN
  Administered 2016-02-07: 10 mg via INTRAVENOUS
  Administered 2016-02-07: 50 mg via INTRAVENOUS
  Administered 2016-02-07: 20 mg via INTRAVENOUS

## 2016-02-07 MED ORDER — GABAPENTIN 300 MG PO CAPS
300.0000 mg | ORAL_CAPSULE | ORAL | Status: AC
  Administered 2016-02-07: 300 mg via ORAL
  Filled 2016-02-07: qty 1

## 2016-02-07 MED ORDER — STERILE WATER FOR IRRIGATION IR SOLN
Status: DC | PRN
  Administered 2016-02-07: 3000 mL

## 2016-02-07 MED ORDER — PHENYLEPHRINE 40 MCG/ML (10ML) SYRINGE FOR IV PUSH (FOR BLOOD PRESSURE SUPPORT)
PREFILLED_SYRINGE | INTRAVENOUS | Status: AC
  Filled 2016-02-07: qty 10

## 2016-02-07 MED ORDER — KETAMINE HCL 10 MG/ML IJ SOLN
INTRAMUSCULAR | Status: AC
  Filled 2016-02-07: qty 1

## 2016-02-07 MED ORDER — FENTANYL CITRATE (PF) 250 MCG/5ML IJ SOLN
INTRAMUSCULAR | Status: AC
  Filled 2016-02-07: qty 5

## 2016-02-07 MED ORDER — HEPARIN SODIUM (PORCINE) 1000 UNIT/ML IJ SOLN: INTRAMUSCULAR | Status: DC | PRN

## 2016-02-07 MED ORDER — SUGAMMADEX SODIUM 200 MG/2ML IV SOLN
INTRAVENOUS | Status: DC | PRN
  Administered 2016-02-07: 200 mg via INTRAVENOUS

## 2016-02-07 MED ORDER — ACETAMINOPHEN 500 MG PO TABS
1000.0000 mg | ORAL_TABLET | Freq: Four times a day (QID) | ORAL | Status: AC
  Administered 2016-02-07 – 2016-02-08 (×3): 1000 mg via ORAL
  Filled 2016-02-07 (×3): qty 2

## 2016-02-07 MED ORDER — MORPHINE SULFATE (PF) 2 MG/ML IV SOLN
2.0000 mg | INTRAVENOUS | Status: DC | PRN
  Administered 2016-02-08: 2 mg via INTRAVENOUS
  Filled 2016-02-07: qty 1

## 2016-02-07 MED ORDER — KETOROLAC TROMETHAMINE 30 MG/ML IJ SOLN: 30.0000 mg | Freq: Once | INTRAMUSCULAR | Status: DC

## 2016-02-07 MED ORDER — 0.9 % SODIUM CHLORIDE (POUR BTL) OPTIME
TOPICAL | Status: DC | PRN
  Administered 2016-02-07: 2000 mL

## 2016-02-07 MED ORDER — DEXAMETHASONE SODIUM PHOSPHATE 10 MG/ML IJ SOLN
INTRAMUSCULAR | Status: AC
  Filled 2016-02-07: qty 1

## 2016-02-07 MED ORDER — ONDANSETRON HCL 4 MG PO TABS: 4.0000 mg | ORAL_TABLET | Freq: Four times a day (QID) | ORAL | Status: DC | PRN

## 2016-02-07 MED ORDER — HEPARIN SODIUM (PORCINE) 5000 UNIT/ML IJ SOLN
5000.0000 [IU] | Freq: Once | INTRAMUSCULAR | Status: AC
  Administered 2016-02-07: 5000 [IU] via SUBCUTANEOUS
  Filled 2016-02-07: qty 1

## 2016-02-07 MED ORDER — ROCURONIUM BROMIDE 50 MG/5ML IV SOSY
PREFILLED_SYRINGE | INTRAVENOUS | Status: AC
  Filled 2016-02-07: qty 5

## 2016-02-07 MED ORDER — MIDAZOLAM HCL 2 MG/2ML IJ SOLN
INTRAMUSCULAR | Status: AC
  Filled 2016-02-07: qty 2

## 2016-02-07 MED ORDER — PROPOFOL 10 MG/ML IV BOLUS
INTRAVENOUS | Status: AC
  Filled 2016-02-07: qty 20

## 2016-02-07 MED ORDER — ENOXAPARIN SODIUM 40 MG/0.4ML ~~LOC~~ SOLN
40.0000 mg | SUBCUTANEOUS | Status: DC
  Administered 2016-02-08 – 2016-02-10 (×3): 40 mg via SUBCUTANEOUS
  Filled 2016-02-07 (×4): qty 0.4

## 2016-02-07 MED ORDER — CEFOTETAN DISODIUM 2 G IJ SOLR
2.0000 g | Freq: Two times a day (BID) | INTRAMUSCULAR | Status: AC
  Administered 2016-02-07: 2 g via INTRAVENOUS
  Filled 2016-02-07: qty 2

## 2016-02-07 MED ORDER — CEFOTETAN DISODIUM-DEXTROSE 2-2.08 GM-% IV SOLR
INTRAVENOUS | Status: AC
  Filled 2016-02-07: qty 50

## 2016-02-07 MED ORDER — KETAMINE HCL 10 MG/ML IJ SOLN
INTRAMUSCULAR | Status: DC | PRN
  Administered 2016-02-07: 30 mg via INTRAVENOUS

## 2016-02-07 MED ORDER — HYDROMORPHONE HCL 1 MG/ML IJ SOLN
0.2500 mg | INTRAMUSCULAR | Status: DC | PRN
  Administered 2016-02-07 (×4): 0.5 mg via INTRAVENOUS

## 2016-02-07 MED ORDER — PHENYLEPHRINE HCL 10 MG/ML IJ SOLN
INTRAMUSCULAR | Status: DC | PRN
  Administered 2016-02-07: 80 ug via INTRAVENOUS

## 2016-02-07 MED ORDER — LORATADINE 10 MG PO TABS
10.0000 mg | ORAL_TABLET | Freq: Every day | ORAL | Status: DC
  Administered 2016-02-07 – 2016-02-10 (×3): 10 mg via ORAL
  Filled 2016-02-07 (×3): qty 1

## 2016-02-07 MED ORDER — LIDOCAINE 2% (20 MG/ML) 5 ML SYRINGE
INTRAMUSCULAR | Status: DC | PRN
  Administered 2016-02-07: 1.5 mg/kg/h via INTRAVENOUS

## 2016-02-07 MED ORDER — LIDOCAINE 2% (20 MG/ML) 5 ML SYRINGE
INTRAMUSCULAR | Status: DC | PRN
  Administered 2016-02-07: 40 mg via INTRAVENOUS

## 2016-02-07 MED ORDER — LACTATED RINGERS IV SOLN
INTRAVENOUS | Status: DC | PRN
  Administered 2016-02-07 (×3): via INTRAVENOUS

## 2016-02-07 MED ORDER — SUGAMMADEX SODIUM 200 MG/2ML IV SOLN
INTRAVENOUS | Status: AC
Start: 2016-02-07 — End: 2016-02-07
  Filled 2016-02-07: qty 2

## 2016-02-07 MED ORDER — DIPHENHYDRAMINE HCL 12.5 MG/5ML PO ELIX
12.5000 mg | ORAL_SOLUTION | Freq: Four times a day (QID) | ORAL | Status: DC | PRN
  Administered 2016-02-08 – 2016-02-10 (×2): 12.5 mg via ORAL
  Filled 2016-02-07 (×2): qty 5

## 2016-02-07 MED ORDER — CELECOXIB 200 MG PO CAPS
400.0000 mg | ORAL_CAPSULE | ORAL | Status: AC
  Administered 2016-02-07: 400 mg via ORAL
  Filled 2016-02-07: qty 2

## 2016-02-07 MED ORDER — IOHEXOL 300 MG/ML  SOLN
INTRAMUSCULAR | Status: DC | PRN
  Administered 2016-02-07: 13 mL

## 2016-02-07 MED ORDER — EPHEDRINE 5 MG/ML INJ
INTRAVENOUS | Status: AC
  Filled 2016-02-07: qty 10

## 2016-02-07 MED ORDER — ALVIMOPAN 12 MG PO CAPS: 12.0000 mg | ORAL_CAPSULE | Freq: Two times a day (BID) | ORAL | Status: DC

## 2016-02-07 MED ORDER — KCL IN DEXTROSE-NACL 20-5-0.45 MEQ/L-%-% IV SOLN
INTRAVENOUS | Status: DC
  Administered 2016-02-07: 15:00:00 via INTRAVENOUS
  Filled 2016-02-07 (×2): qty 1000

## 2016-02-07 MED ORDER — LACTATED RINGERS IV SOLN: INTRAVENOUS | Status: DC

## 2016-02-07 MED ORDER — HEPARIN SODIUM (PORCINE) 5000 UNIT/ML IJ SOLN
INTRAMUSCULAR | Status: AC
  Filled 2016-02-07: qty 1

## 2016-02-07 MED ORDER — FENTANYL CITRATE (PF) 100 MCG/2ML IJ SOLN
INTRAMUSCULAR | Status: DC | PRN
  Administered 2016-02-07: 50 ug via INTRAVENOUS
  Administered 2016-02-07: 100 ug via INTRAVENOUS
  Administered 2016-02-07 (×2): 50 ug via INTRAVENOUS

## 2016-02-07 MED ORDER — LACTATED RINGERS IR SOLN
Status: DC | PRN
  Administered 2016-02-07: 3000 mL

## 2016-02-07 MED ORDER — HYDROMORPHONE HCL 1 MG/ML IJ SOLN
INTRAMUSCULAR | Status: AC
  Filled 2016-02-07: qty 1

## 2016-02-07 MED ORDER — PROPOFOL 10 MG/ML IV BOLUS
INTRAVENOUS | Status: DC | PRN
  Administered 2016-02-07: 150 mg via INTRAVENOUS

## 2016-02-07 MED ORDER — BUPIVACAINE HCL (PF) 0.5 % IJ SOLN
INTRAMUSCULAR | Status: AC
  Filled 2016-02-07: qty 30

## 2016-02-07 MED ORDER — PROMETHAZINE HCL 25 MG/ML IJ SOLN: 6.2500 mg | INTRAMUSCULAR | Status: DC | PRN

## 2016-02-07 MED ORDER — DIPHENHYDRAMINE HCL 50 MG/ML IJ SOLN: 12.5000 mg | Freq: Four times a day (QID) | INTRAMUSCULAR | Status: DC | PRN

## 2016-02-07 MED ORDER — MIDAZOLAM HCL 5 MG/5ML IJ SOLN
INTRAMUSCULAR | Status: DC | PRN
  Administered 2016-02-07: 2 mg via INTRAVENOUS

## 2016-02-07 MED ORDER — DEXTROSE 5 % IV SOLN
2.0000 g | INTRAVENOUS | Status: AC
  Administered 2016-02-07: 2 g via INTRAVENOUS

## 2016-02-07 MED ORDER — ONDANSETRON HCL 4 MG/2ML IJ SOLN
INTRAMUSCULAR | Status: AC
  Filled 2016-02-07: qty 2

## 2016-02-07 MED ORDER — DEXAMETHASONE SODIUM PHOSPHATE 10 MG/ML IJ SOLN
INTRAMUSCULAR | Status: DC | PRN
  Administered 2016-02-07: 10 mg via INTRAVENOUS

## 2016-02-07 MED ORDER — ONDANSETRON HCL 4 MG/2ML IJ SOLN
INTRAMUSCULAR | Status: DC | PRN
  Administered 2016-02-07: 4 mg via INTRAVENOUS

## 2016-02-07 MED ORDER — BUPIVACAINE HCL (PF) 0.5 % IJ SOLN
INTRAMUSCULAR | Status: DC | PRN
  Administered 2016-02-07: 30 mL

## 2016-02-07 MED ORDER — ACETAMINOPHEN 500 MG PO TABS
1000.0000 mg | ORAL_TABLET | ORAL | Status: AC
  Administered 2016-02-07: 1000 mg via ORAL
  Filled 2016-02-07: qty 2

## 2016-02-07 SURGICAL SUPPLY — 107 items
ADAPTER GOLDBERG URETERAL (ADAPTER) ×4 IMPLANT
BAG URINE DRAINAGE (UROLOGICAL SUPPLIES) ×4 IMPLANT
BAG URO CATCHER STRL LF (MISCELLANEOUS) ×4 IMPLANT
BLADE EXTENDED COATED 6.5IN (ELECTRODE) IMPLANT
CANNULA REDUC XI 12-8 STAPL (CANNULA) ×1
CANNULA REDUC XI 12-8MM STAPL (CANNULA) ×1
CANNULA REDUCER 12-8 DVNC XI (CANNULA) ×2 IMPLANT
CATH FOLEY 3WAY  5CC 16FR (CATHETERS) ×2
CATH FOLEY 3WAY 5CC 16FR (CATHETERS) ×2 IMPLANT
CATH INTERMIT  6FR 70CM (CATHETERS) ×4 IMPLANT
CATH URET 5FR 28IN OPEN ENDED (CATHETERS) ×4 IMPLANT
CELLS DAT CNTRL 66122 CELL SVR (MISCELLANEOUS) IMPLANT
CLIP LIGATING HEM O LOK PURPLE (MISCELLANEOUS) IMPLANT
CLIP LIGATING HEMOLOK MED (MISCELLANEOUS) IMPLANT
CLOTH BEACON ORANGE TIMEOUT ST (SAFETY) ×4 IMPLANT
COUNTER NEEDLE 20 DBL MAG RED (NEEDLE) ×4 IMPLANT
COVER MAYO STAND STRL (DRAPES) ×8 IMPLANT
COVER TIP SHEARS 8 DVNC (MISCELLANEOUS) ×2 IMPLANT
COVER TIP SHEARS 8MM DA VINCI (MISCELLANEOUS) ×2
DECANTER SPIKE VIAL GLASS SM (MISCELLANEOUS) ×4 IMPLANT
DERMABOND ADVANCED (GAUZE/BANDAGES/DRESSINGS) ×2
DERMABOND ADVANCED .7 DNX12 (GAUZE/BANDAGES/DRESSINGS) ×2 IMPLANT
DEVICE TROCAR PUNCTURE CLOSURE (ENDOMECHANICALS) IMPLANT
DRAIN CHANNEL 19F RND (DRAIN) IMPLANT
DRAPE ARM DVNC X/XI (DISPOSABLE) ×8 IMPLANT
DRAPE COLUMN DVNC XI (DISPOSABLE) ×2 IMPLANT
DRAPE DA VINCI XI ARM (DISPOSABLE) ×8
DRAPE DA VINCI XI COLUMN (DISPOSABLE) ×2
DRAPE SURG IRRIG POUCH 19X23 (DRAPES) ×4 IMPLANT
DRSG OPSITE POSTOP 4X10 (GAUZE/BANDAGES/DRESSINGS) IMPLANT
DRSG OPSITE POSTOP 4X6 (GAUZE/BANDAGES/DRESSINGS) ×4 IMPLANT
DRSG OPSITE POSTOP 4X8 (GAUZE/BANDAGES/DRESSINGS) IMPLANT
ELECT PENCIL ROCKER SW 15FT (MISCELLANEOUS) ×8 IMPLANT
ELECT REM PT RETURN 15FT ADLT (MISCELLANEOUS) ×4 IMPLANT
ENDOLOOP SUT PDS II  0 18 (SUTURE)
ENDOLOOP SUT PDS II 0 18 (SUTURE) IMPLANT
EVACUATOR SILICONE 100CC (DRAIN) IMPLANT
GAUZE SPONGE 4X4 12PLY STRL (GAUZE/BANDAGES/DRESSINGS) IMPLANT
GLOVE BIO SURGEON STRL SZ 6.5 (GLOVE) ×9 IMPLANT
GLOVE BIO SURGEONS STRL SZ 6.5 (GLOVE) ×3
GLOVE BIOGEL M STRL SZ7.5 (GLOVE) ×4 IMPLANT
GLOVE BIOGEL PI IND STRL 7.0 (GLOVE) ×6 IMPLANT
GLOVE BIOGEL PI INDICATOR 7.0 (GLOVE) ×6
GOWN STRL REUS W/TWL 2XL LVL3 (GOWN DISPOSABLE) ×12 IMPLANT
GOWN STRL REUS W/TWL LRG LVL3 (GOWN DISPOSABLE) ×8 IMPLANT
GOWN STRL REUS W/TWL XL LVL3 (GOWN DISPOSABLE) ×16 IMPLANT
GRASPER ENDOPATH ANVIL 10MM (MISCELLANEOUS) ×4 IMPLANT
GUIDEWIRE STR DUAL SENSOR (WIRE) ×4 IMPLANT
HOLDER FOLEY CATH W/STRAP (MISCELLANEOUS) ×4 IMPLANT
IRRIG SUCT STRYKERFLOW 2 WTIP (MISCELLANEOUS) ×4
IRRIGATION SUCT STRKRFLW 2 WTP (MISCELLANEOUS) ×2 IMPLANT
IV CONNECTOR ONE LINK NDLESS (IV SETS) ×4 IMPLANT
KIT PROCEDURE DA VINCI SI (MISCELLANEOUS) ×2
KIT PROCEDURE DVNC SI (MISCELLANEOUS) ×2 IMPLANT
KIT URETTERAL LIGHTED STENTS (STENTS) ×4 IMPLANT
LEGGING LITHOTOMY PAIR STRL (DRAPES) ×4 IMPLANT
MANIFOLD NEPTUNE II (INSTRUMENTS) ×4 IMPLANT
NEEDLE INSUFFLATION 14GA 120MM (NEEDLE) ×4 IMPLANT
PACK CARDIOVASCULAR III (CUSTOM PROCEDURE TRAY) ×4 IMPLANT
PACK COLON (CUSTOM PROCEDURE TRAY) ×4 IMPLANT
PACK CYSTO (CUSTOM PROCEDURE TRAY) ×4 IMPLANT
PORT LAP GEL ALEXIS MED 5-9CM (MISCELLANEOUS) ×4 IMPLANT
RTRCTR WOUND ALEXIS 18CM MED (MISCELLANEOUS)
SCISSORS LAP 5X35 DISP (ENDOMECHANICALS) ×4 IMPLANT
SEAL CANN UNIV 5-8 DVNC XI (MISCELLANEOUS) ×6 IMPLANT
SEAL XI 5MM-8MM UNIVERSAL (MISCELLANEOUS) ×6
SEALER VESSEL DA VINCI XI (MISCELLANEOUS) ×2
SEALER VESSEL EXT DVNC XI (MISCELLANEOUS) ×2 IMPLANT
SET BI-LUMEN FLTR TB AIRSEAL (TUBING) ×4 IMPLANT
SLEEVE ADV FIXATION 5X100MM (TROCAR) IMPLANT
SOLUTION ELECTROLUBE (MISCELLANEOUS) ×4 IMPLANT
STAPLER 45 BLU RELOAD XI (STAPLE) IMPLANT
STAPLER 45 BLUE RELOAD XI (STAPLE)
STAPLER 45 GREEN RELOAD XI (STAPLE) ×2
STAPLER 45 GRN RELOAD XI (STAPLE) ×2 IMPLANT
STAPLER CANNULA SEAL DVNC XI (STAPLE) ×2 IMPLANT
STAPLER CANNULA SEAL XI (STAPLE) ×2
STAPLER CIRC ILS CVD 33MM 37CM (STAPLE) ×4 IMPLANT
STAPLER SHEATH (SHEATH) ×2
STAPLER SHEATH ENDOWRIST DVNC (SHEATH) ×2 IMPLANT
STAPLER VISISTAT 35W (STAPLE) ×4 IMPLANT
SUT ETHILON 2 0 PS N (SUTURE) IMPLANT
SUT NOVA NAB GS-21 0 18 T12 DT (SUTURE) ×8 IMPLANT
SUT PDS AB 1 CTX 36 (SUTURE) IMPLANT
SUT PDS AB 1 TP1 96 (SUTURE) IMPLANT
SUT PROLENE 2 0 KS (SUTURE) ×4 IMPLANT
SUT SILK 2 0 (SUTURE) ×2
SUT SILK 2 0 SH CR/8 (SUTURE) ×4 IMPLANT
SUT SILK 2-0 18XBRD TIE 12 (SUTURE) ×2 IMPLANT
SUT SILK 3 0 (SUTURE) ×2
SUT SILK 3 0 SH CR/8 (SUTURE) ×4 IMPLANT
SUT SILK 3-0 18XBRD TIE 12 (SUTURE) ×2 IMPLANT
SUT V-LOC BARB 180 2/0GR6 GS22 (SUTURE)
SUT VIC AB 2-0 SH 18 (SUTURE) ×4 IMPLANT
SUT VIC AB 2-0 SH 27 (SUTURE) ×2
SUT VIC AB 2-0 SH 27X BRD (SUTURE) ×2 IMPLANT
SUT VIC AB 3-0 SH 18 (SUTURE) IMPLANT
SUT VIC AB 4-0 PS2 27 (SUTURE) ×8 IMPLANT
SUTURE V-LC BRB 180 2/0GR6GS22 (SUTURE) IMPLANT
SYR 10ML LL (SYRINGE) ×4 IMPLANT
SYS LAPSCP GELPORT 120MM (MISCELLANEOUS)
SYSTEM LAPSCP GELPORT 120MM (MISCELLANEOUS) IMPLANT
TOWEL OR 17X26 10 PK STRL BLUE (TOWEL DISPOSABLE) ×4 IMPLANT
TOWEL OR NON WOVEN STRL DISP B (DISPOSABLE) ×4 IMPLANT
TROCAR ADV FIXATION 5X100MM (TROCAR) ×4 IMPLANT
TUBING CONNECTING 10 (TUBING) ×3 IMPLANT
TUBING CONNECTING 10' (TUBING) ×1

## 2016-02-07 NOTE — Op Note (Signed)
02/07/2016  11:57 AM  PATIENT:  Donna Garrett  65 y.o. female  Patient Care Team: Golda Acre, MD as PCP - General (Family Medicine)  PRE-OPERATIVE DIAGNOSIS:  colovaginal fistula, diverticular disease  POST-OPERATIVE DIAGNOSIS:  colovaginal fistula, diverticular disease  PROCEDURE:   XI ROBOT LOW ANTERIOR RESECTION WITH RIGID PROCTOSCOPY AND SPLENIC FLEXURE MOBILIZATION CYSTOSCOPY WITH LEFT STENT PLACEMENT WITH INJECTION OF ICG DYE IN RIGHT URETER  SURGEON:  Surgeon(s): Romie Levee, MD Karie Soda, MD Crist Fat, MD  ASSISTANT: Dr Michaell Cowing   ANESTHESIA:   local and general  EBL: Total I/O In: 2600 [I.V.:2600] Out: 100 [Urine:50; Blood:50]  Delay start of Pharmacological VTE agent (>24hrs) due to surgical blood loss or risk of bleeding:  no  DRAINS: none   SPECIMEN:  Source of Specimen:  Rectosigmoid colon  DISPOSITION OF SPECIMEN:  PATHOLOGY  COUNTS:  YES  PLAN OF CARE: Admit to inpatient   PATIENT DISPOSITION:  PACU - hemodynamically stable.  INDICATION:    65 y.o. F with large diverticular abscess, stricture and colovaginal fistula.  I recommended segmental resection:  The anatomy & physiology of the digestive tract was discussed.  The pathophysiology was discussed.  Natural history risks without surgery was discussed.   I worked to give an overview of the disease and the frequent need to have multispecialty involvement.  I feel the risks of no intervention will lead to serious problems that outweigh the operative risks; therefore, I recommended a partial colectomy to remove the pathology.  Laparoscopic & open techniques were discussed.   Risks such as bleeding, infection, abscess, leak, reoperation, possible ostomy, hernia, heart attack, death, and other risks were discussed.  I noted a good likelihood this will help address the problem.   Goals of post-operative recovery were discussed as well.    The patient expressed understanding & wished  to proceed with surgery.  OR FINDINGS:   Patient had significant inflammation along the L pelvic sidewall.     The anastomosis rests 9 cm from the anal verge by rigid proctoscopy.  DESCRIPTION:   Informed consent was confirmed.  The patient underwent general anaesthesia without difficulty.  The patient was positioned appropriately.  VTE prevention in place.  The patient's abdomen was clipped, prepped, & draped in a sterile fashion.  Surgical timeout confirmed our plan.  The patient was positioned in reverse Trendelenburg.  Abdominal entry was gained using a Varies needle in the LUQ.  Entry was clean.  I induced carbon dioxide insufflation.  A 8mm robotic port was placed.  Camera inspection revealed no injury.  Extra ports were carefully placed under direct laparoscopic visualization.  Upon visualization of the abdomen there was a small portion of small bowel that was adherent to the colon. This was taken down using laparoscopic scissors. This was then placed in the abdomen for inspection later.   I reflected the greater omentum and the upper abdomen the small bowel in the upper abdomen.  The robot was then docked to the patient's left side. Ports were placed under direct visualization. I began to mobilize the colon off of the left pelvic sidewall using blunt and sharp dissection. I could easily see the left ureteral stent and was able to divide clear of the ureter. I scored the base of peritoneum of the right side of the mesentery of the left colon from the ligament of Treitz to the peritoneal reflection of the mid rectum.   This allowed me to mobilize the colon enough to  eventually get around the into the sigmoid which was adherent within the pelvis.  The patient had significant inflammation around the vaginal cuff. This was carefully dissected free. There was no true vaginal injury that I could identify. A small nodule could be palpated in the distal vaginal cuff.  I elevated the sigmoid mesentery  and enetered into the retro-mesenteric plane. We were able to identify the left ureter and gonadal vessels. We kept those posterior within the retroperitoneum and elevated the left colon mesentery off that. I did isolated IMA pedicle but did not ligate it yet.  I continued distally and got into the avascular plane posterior to the mesorectum. This allowed me to help mobilize the rectum as well by freeing the mesorectum off the sacrum.  I mobilized the peritoneal coverings towards the peritoneal reflection on both the right and left sides of the rectum.  I could see the right and left ureters and stayed away from them.    I skeletonized the inferior mesenteric artery pedicle.  I went down to its takeoff from the aorta.   I isolated the inferior mesenteric vein off of the ligament of Treitz just cephalad to that as well.  After confirming the left ureter was out of the way, I went ahead and ligated the inferior mesenteric artery pedicle with bipolar robotic vessel sealer ~2cm above its takeoff from the aorta.   I did ligate the inferior mesenteric vein in a similar fashion.  We ensured hemostasis.  I dissected into the anterior plane between the rectum and vagina. I found a portion of proximal rectum which was free from inflammation.  I skeletonized the mesorectum at the proximal rectum using blunt dissection & bipolar robotic vessel sealer.  I mobilized the left colon in a lateral to medial fashion off the line of Toldt up towards the splenic flexure to ensure good mobilization of the left colon to reach into the pelvis.  I could not get the colon to mobilize into the pelvis freely. The robot was undocked and repositioned towards the left upper quadrant. The splenic flexure was then taken down using the robotic vessel sealer. The omentum was freed from the colon as well using the robotic vessel sealer. After this the colon reached into the pelvis without difficulty.  I undocked the robot and removed the 12 mm port.  This was enlarged and an Alexis wound protector was placed. The colon was brought out through this incision. A pursestring device was placed on the proximal colon a 2-0 Prolene suture was used to create the pursestring. The colon was divided and sent to pathology for further examination. A 33 mm EEA anvil was placed into the colon and the pursestring was tied tightly around this. This was then placed back into the abdomen and an anastomosis was created under laparoscopic visualization. There was no tension. There was no leak when insufflated under water. The abdomen was then irrigated. Hemostasis was good. The anastomosis rests approximally 9 centimeters from the anal verge.   The omentum was then brought down over the abdomen. I brought out the small bowel that was resected from the colon through the [facility] wound protector. There was a small pinhole in this. This was close using interrupted 3-0 silk sutures. This was placed back into the abdomen and the Alexis wound protector was removed. We switched to clean gowns, gloves, drapes and instruments.  The extraction site was closed using a 2-0 Vicryl to close the peritoneum. 0 Novafil sutures were used to  close the anterior fascia. This was done in interrupted fashion. 2-0 Vicryl sutures were used to close the subcutaneous tissue and a 4-0 Vicryl running subcuticular suture was used to close the skin. The port sites were also closed using the 4-0 Vicryl. Skin glue was placed on the port sites and a sterile dressing was placed over the extraction site. The left ureteral stent was removed. The patient was awakened from anesthesia and sent to the post anesthesia care unit in stable condition. All counts were correct per operating room staff.

## 2016-02-07 NOTE — Op Note (Signed)
Preoperative diagnosis:  1. Perforated diverticulitis   Postoperative diagnosis:  1. Same   Procedure: 1. Cystoscopy, left retrograde pyelogram with interpretation, left lighted stent placement  Surgeon: Crist FatBenjamin W. Terriona Horlacher, MD  Anesthesia: General  Complications: None  Intraoperative findings: Cystoscopy demonstrated normal-appearing bladder with no mucosal abnormalities.  The ureteral orifices were orthotopic.  The left retrograde pyelogram was performed using 10 cc of Omnipaque contrast.  This demonstrates a normal caliber ureter with no filling defects.  There is no hydronephrosis with sharp calyces in the upper tract.  EBL: Minimal  Specimens: None  Indication: Birder RobsonWanda S Sansone is a 65 y.o. patient with diverticulitis.  The patient was seen and opted for resection of her diverticulitis by Dr. Maisie Fushomas.  As part of the procedure, Dr. Maisie Fushomas has requested that a lighted stent be placed in the left ureter.  After reviewing the management options for treatment, he elected to proceed with the above surgical procedure(s). We have discussed the potential benefits and risks of the procedure, side effects of the proposed treatment, the likelihood of the patient achieving the goals of the procedure, and any potential problems that might occur during the procedure or recuperation. Informed consent has been obtained.  Description of procedure:  The patient was taken to the operating room and general anesthesia was induced.  The patient was placed in the dorsal lithotomy position, prepped and draped in the usual sterile fashion, and preoperative antibiotics were administered. A preoperative time-out was performed.   Cystoscopy was performed using a 30 21 French cystoscope with the above findings.  Using a 5 [place]French open-ended catheter I performed retrograde pyelogram in the left ureter with the above findings.  I then exchanged the 5 French opening catheter for a 0.038 sensor wire and removed the  catheter over the wire.  I then passed the stent sheath over the wire and into the left ureter advancing it proximally 15 cm.  I then backed the scope out leaving the sheath behind.  I then advanced the light apparatus through the sheath and passed up to the hub.  I then placed a 16 French Foley catheter and secured the sheath to the catheter.  The catheter was then taped to the patient's leg.  The case was then turned over to Dr. Maisie Fushomas.    Crist FatBenjamin W. Jerrilyn Messinger, M.D.

## 2016-02-07 NOTE — Anesthesia Procedure Notes (Signed)
Procedure Name: Intubation Date/Time: 02/07/2016 7:42 AM Performed by: Jhonnie GarnerMARSHALL, Lugene Beougher M Pre-anesthesia Checklist: Emergency Drugs available, Suction available, Patient identified and Patient being monitored Patient Re-evaluated:Patient Re-evaluated prior to inductionOxygen Delivery Method: Circle system utilized Preoxygenation: Pre-oxygenation with 100% oxygen Intubation Type: IV induction Ventilation: Mask ventilation without difficulty Laryngoscope Size: Miller and 2 Grade View: Grade I Tube type: Oral Tube size: 7.0 mm Number of attempts: 1 Airway Equipment and Method: Stylet Placement Confirmation: ETT inserted through vocal cords under direct vision,  positive ETCO2 and breath sounds checked- equal and bilateral Secured at: 21 cm Tube secured with: Tape Dental Injury: Teeth and Oropharynx as per pre-operative assessment

## 2016-02-07 NOTE — H&P (Signed)
HPI: Pt was seen in the office a few months ago. Her symptoms included abd pain and weight loss as well as stool per vagina. She was noted to have a mass in LLQ as well. She underwent a CT guided drain placement for a benign appearing abscess.  Her abscess has now resolved.  She was unable to undergo screening colonoscopy due to stricture.       Past Medical History:  Diagnosis Date  . Anemia 40 yrs ago  . Diverticulitis 08/2015  . Family history of adverse reaction to anesthesia    mother hx of ponv  . Hypertension    no bp meds for last 2 months since 31 pound weight loss  . Nocturia           Past Surgical History:  Procedure Laterality Date  . ABDOMINAL HYSTERECTOMY  age 65   partial  . BRAIN AVM REPAIR  1999  . IR GENERIC HISTORICAL  11/26/2015   IR SINUS/FIST TUBE CHK-NON GI 11/26/2015 Berdine DanceMichael Shick, MD [facility]  . left ear surgery to remove bone stainles teel inserted  age 65  . left side tube inserted into mass of side infection for diverticulitis  11/2015  . TONSILLECTOMY  as chikl  . TUBAL LIGATION       History reviewed. No pertinent family history.  Social History: has no tobacco, alcohol, and drug history  No Known Allergies  Current Meds  Medication Sig  . acetaminophen (TYLENOL) 500 MG tablet Take 500-1,000 mg by mouth every 6 (six) hours as needed for mild pain or headache.  Marland Kitchen. amoxicillin-clavulanate (AUGMENTIN) 875-125 MG tablet Take 1 tablet by mouth 2 (two) times daily. Patient to stop taking on 02/06/16---may have to resume following procedure.  . cetirizine (ZYRTEC) 10 MG tablet Take 10 mg by mouth daily.  . Cholecalciferol (VITAMIN D3) 2000 units TABS Take 2,000 Units by mouth daily at 2 PM.  . diphenhydrAMINE (BENADRYL) 25 mg capsule Take 25 mg by mouth at bedtime as needed (for allergies.).  Marland Kitchen. metroNIDAZOLE (FLAGYL) 500 MG tablet Take 1,000 mg by mouth as directed. 2 tablets by mouth at 2 pm, 3 pm,  And 10 pm the day  preceding surgery  . neomycin (MYCIFRADIN) 500 MG tablet Take 1,000 mg by mouth as directed. 2 tablets by mouth at 2 pm, 3 pm,  And 10 pm the day preceding surgery  . Turmeric 500 MG CAPS Take 500 mg by mouth daily at 2 PM.  . [DISCONTINUED] cetirizine (ZYRTEC) 10 MG tablet Take 10 mg by mouth daily as needed for allergies.    Review of Systems  Constitutional: Positive for malaise/fatigueand weight loss. Negative for chillsand fever.  HENT: Negative for hearing loss.  Eyes: Negative for blurred visionand double vision.  Respiratory: Negative for cough, sputum productionand shortness of breath.  Cardiovascular: Negative for chest painand palpitations.  Gastrointestinal: No abd pain. Negative for constipation, diarrhea, nauseaand vomiting.  Genitourinary: Negative for dysuria, frequencyand urgency.  Skin: Negative for itchingand rash.  Neurological: No weakness. Negative for dizzinessand headaches.    BP (!) 145/82   Pulse 84   Temp 97.6 F (36.4 C) (Oral)   Resp 18   Ht 5\' 1"  (1.549 m)   Wt 57.6 kg (127 lb)   SpO2 99%   BMI 24.00 kg/m    Physical Exam Constitutional: She is oriented to person, place, and time. She appears well-developedand well-nourished. No distress.  HENT:  Head: Normocephalicand atraumatic.  Eyes: Conjunctivaeand EOMare normal. Pupils  are equal, round, and reactive to light.  Neck: Normal range of motion.  Cardiovascular: Normal rateand regular rhythm.  Respiratory: Effort normaland breath sounds normal. No respiratory distress.  GI: Soft.  She exhibits no distension. No tenderness. There is no reboundand no guarding.  Musculoskeletal: Normal range of motion.  Neurological: She is alertand oriented to person, place, and time.  Skin: Skin is warmand dry. She is not diaphoretic.    Assessment/Plan 65 y.o.F with a h/o chronic appearing abdominal abscesses on CT and IR drainage, s/p drainage.  Now here for resection.  The  surgery and anatomy were described to the patient as well as the risks of surgery and the possible complications.  These include: Bleeding, deep abdominal infections and possible wound complications such as hernia and infection, damage to adjacent structures, leak of surgical connections, which can lead to other surgeries and possibly an ostomy, possible need for other procedures, such as abscess drains in radiology, possible prolonged hospital stay, possible diarrhea from removal of part of the colon, possible constipation from narcotics, prolonged fatigue/weakness or appetite loss, possible early recurrence of of disease, possible complications of their medical problems such as heart disease or arrhythmias or lung problems, death (less than 1%). I believe the patient understands and wishes to proceed with the surgery.

## 2016-02-07 NOTE — Transfer of Care (Signed)
Immediate Anesthesia Transfer of Care Note  Patient: Donna Garrett  Procedure(s) Performed: Procedure(s): XI ROBOT LOW ANTERIOR RESECTION WITH RIGID PROCTOSCOPY AND SPLENIC FLEXURE MOBILIZATION (N/A) CYSTOSCOPY WITH LEFT STENT PLACEMENT WITH INJECTION OF ICG DYE IN RIGHT URETER (N/A)  Patient Location: PACU  Anesthesia Type:General  Level of Consciousness:  sedated, patient cooperative and responds to stimulation  Airway & Oxygen Therapy:Patient Spontanous Breathing and Patient connected to face mask oxgen  Post-op Assessment:  Report given to PACU RN and Post -op Vital signs reviewed and stable  Post vital signs:  Reviewed and stable  Last Vitals:  Vitals:   02/07/16 0551  BP: (!) 145/82  Pulse: 84  Resp: 18  Temp: 36.4 C    Complications: No apparent anesthesia complications

## 2016-02-07 NOTE — Anesthesia Postprocedure Evaluation (Signed)
Anesthesia Post Note  Patient: Donna Garrett  Procedure(s) Performed: Procedure(s) (LRB): XI ROBOT LOW ANTERIOR RESECTION WITH RIGID PROCTOSCOPY AND SPLENIC FLEXURE MOBILIZATION (N/A) CYSTOSCOPY WITH LEFT STENT PLACEMENT WITH INJECTION OF ICG DYE IN RIGHT URETER (N/A)  Patient location during evaluation: PACU Anesthesia Type: General Level of consciousness: awake and sedated Pain management: pain level controlled Vital Signs Assessment: post-procedure vital signs reviewed and stable Respiratory status: spontaneous breathing Cardiovascular status: stable Postop Assessment: no signs of nausea or vomiting Anesthetic complications: no     Last Vitals:  Vitals:   02/07/16 1245 02/07/16 1300  BP: 128/83 (!) 118/49  Pulse: 67 (!) 59  Resp: 12 13  Temp: 36.4 C 36.6 C    Last Pain:  Vitals:   02/07/16 1300  TempSrc:   PainSc: 4    Pain Goal:                 Zayne Draheim JR,JOHN Joevanni Roddey

## 2016-02-07 NOTE — Anesthesia Preprocedure Evaluation (Signed)
Anesthesia Evaluation  Patient identified by MRN, date of birth, ID band Patient awake    Reviewed: Allergy & Precautions, NPO status , Patient's Chart, lab work & pertinent test results  Airway Mallampati: I       Dental no notable dental hx. (+)    Pulmonary former smoker,    Pulmonary exam normal        Cardiovascular hypertension, Pt. on medications Normal cardiovascular exam     Neuro/Psych negative neurological ROS  negative psych ROS   GI/Hepatic negative GI ROS, Neg liver ROS,   Endo/Other  negative endocrine ROS  Renal/GU negative Renal ROS  negative genitourinary   Musculoskeletal negative musculoskeletal ROS (+)   Abdominal Normal abdominal exam  (+)   Peds negative pediatric ROS (+)  Hematology   Anesthesia Other Findings   Reproductive/Obstetrics negative OB ROS                             Anesthesia Physical Anesthesia Plan  ASA: II  Anesthesia Plan: General   Post-op Pain Management:    Induction: Intravenous  Airway Management Planned: Oral ETT  Additional Equipment:   Intra-op Plan:   Post-operative Plan: Extubation in OR  Informed Consent: I have reviewed the patients History and Physical, chart, labs and discussed the procedure including the risks, benefits and alternatives for the proposed anesthesia with the patient or authorized representative who has indicated his/her understanding and acceptance.   Dental advisory given  Plan Discussed with: CRNA and Surgeon  Anesthesia Plan Comments:         Anesthesia Quick Evaluation

## 2016-02-08 LAB — CBC
HEMATOCRIT: 28.9 % — AB (ref 36.0–46.0)
Hemoglobin: 9.3 g/dL — ABNORMAL LOW (ref 12.0–15.0)
MCH: 32.1 pg (ref 26.0–34.0)
MCHC: 32.2 g/dL (ref 30.0–36.0)
MCV: 99.7 fL (ref 78.0–100.0)
PLATELETS: 288 10*3/uL (ref 150–400)
RBC: 2.9 MIL/uL — ABNORMAL LOW (ref 3.87–5.11)
RDW: 15 % (ref 11.5–15.5)
WBC: 7.7 10*3/uL (ref 4.0–10.5)

## 2016-02-08 LAB — BASIC METABOLIC PANEL
Anion gap: 7 (ref 5–15)
BUN: 6 mg/dL (ref 6–20)
CALCIUM: 8.7 mg/dL — AB (ref 8.9–10.3)
CO2: 26 mmol/L (ref 22–32)
CREATININE: 0.57 mg/dL (ref 0.44–1.00)
Chloride: 103 mmol/L (ref 101–111)
GFR calc Af Amer: >60 mL/min (ref >60)
GLUCOSE: 123 mg/dL — AB (ref 65–99)
POTASSIUM: 4.3 mmol/L (ref 3.5–5.1)
Sodium: 136 mmol/L (ref 135–145)

## 2016-02-08 MED ORDER — ACETAMINOPHEN 500 MG PO TABS
1000.0000 mg | ORAL_TABLET | Freq: Four times a day (QID) | ORAL | Status: AC
  Administered 2016-02-08 – 2016-02-09 (×4): 1000 mg via ORAL
  Filled 2016-02-08 (×4): qty 2

## 2016-02-08 MED ORDER — ZOLPIDEM TARTRATE 5 MG PO TABS
5.0000 mg | ORAL_TABLET | Freq: Every evening | ORAL | Status: DC | PRN
  Administered 2016-02-08 – 2016-02-09 (×2): 5 mg via ORAL
  Filled 2016-02-08 (×2): qty 1

## 2016-02-08 MED ORDER — LACTATED RINGERS IV BOLUS (SEPSIS): 1000.0000 mL | Freq: Two times a day (BID) | INTRAVENOUS | Status: DC | PRN

## 2016-02-08 NOTE — Progress Notes (Signed)
1 Day Post-Op Robotic LAR for diverticular stricture Subjective: Had a BM, passing flatus.  Not using narcotics, pain ok with tylenol  Objective: Vital signs in last 24 hours: Temp:  [95 F (35 C)-98.5 F (36.9 C)] 98.4 F (36.9 C) (11/04 0530) Pulse Rate:  [59-83] 66 (11/04 0530) Resp:  [10-17] 16 (11/04 0530) BP: (82-149)/(46-86) 82/46 (11/04 0530) SpO2:  [97 %-100 %] 99 % (11/04 0530)   Intake/Output from previous day: 11/03 0701 - 11/04 0700 In: 4435 [P.O.:480; I.V.:3905; IV Piggyback:50] Out: 950 [Urine:900; Blood:50] Intake/Output this shift: No intake/output data recorded.   General appearance: alert and cooperative GI: soft, non-distended  Incision: no significant drainage  Lab Results:   Recent Labs  02/08/16 0505  WBC 7.7  HGB 9.3*  HCT 28.9*  PLT 288   BMET  Recent Labs  02/08/16 0505  NA 136  K 4.3  CL 103  CO2 26  GLUCOSE 123*  BUN 6  CREATININE 0.57  CALCIUM 8.7*   PT/INR No results for input(s): LABPROT, INR in the last 72 hours. ABG No results for input(s): PHART, HCO3 in the last 72 hours.  Invalid input(s): PCO2, PO2  MEDS, Scheduled . acetaminophen  1,000 mg Oral Q6H  . enoxaparin (LOVENOX) injection  40 mg Subcutaneous Q24H  . loratadine  10 mg Oral Daily    Studies/Results: Dg Retrograde Pyelogram  Result Date: 02/07/2016 CLINICAL DATA:  65 year old female with a history of left-sided the ureteral stent placement. EXAM: RETROGRADE PYELOGRAM COMPARISON:  11/14/2015, CT 11/14/2015 FINDINGS: Limited intraoperative fluoroscopic spot images during left-sided retrograde pyelogram. Initial image demonstrates retrograde infusion of contrast within the left ureter partially opacifying the left collecting system. No evidence of hydronephrosis. No large filling defect of the left ureter. There is medialization of the distal third of the left ureter projecting over the sacrum. IMPRESSION: Limited images during left-sided retrograde  pyelogram demonstrates no hydronephrosis or filling defects within the left ureter. The distal third of the left ureter has a medial course, potentially related to inflammation/ scarring given the changes on the prior CT. Please refer to the dictated operative report for full details of intraoperative findings and procedure. Signed, Yvone NeuJaime S. Loreta AveWagner, DO [facility] Electronically Signed   By: Gilmer MorJaime  Wagner D.O.   On: 02/07/2016 09:24    Assessment: s/p Procedure(s): XI ROBOT LOW ANTERIOR RESECTION WITH RIGID PROCTOSCOPY AND SPLENIC FLEXURE MOBILIZATION CYSTOSCOPY WITH LEFT STENT PLACEMENT WITH INJECTION OF ICG DYE IN RIGHT URETER Patient Active Problem List   Diagnosis Date Noted  . Diverticular disease 02/07/2016  . Intra-abdominal abscess (HCC) 11/03/2015    Expected post op course  Plan: Advance diet to fulls Cont foley today Ambulate Ambien at night for sleep Cont scheduled tylenol   LOS: 1 day     .Vanita PandaAlicia C Paulena Servais, MD [facility], [place] [phone]   02/08/2016 8:01 AM

## 2016-02-09 LAB — BASIC METABOLIC PANEL
ANION GAP: 5 (ref 5–15)
BUN: 6 mg/dL (ref 6–20)
CALCIUM: 8.7 mg/dL — AB (ref 8.9–10.3)
CO2: 28 mmol/L (ref 22–32)
Chloride: 104 mmol/L (ref 101–111)
Creatinine, Ser: 0.54 mg/dL (ref 0.44–1.00)
GLUCOSE: 99 mg/dL (ref 65–99)
POTASSIUM: 4.3 mmol/L (ref 3.5–5.1)
SODIUM: 137 mmol/L (ref 135–145)

## 2016-02-09 LAB — CBC
HEMATOCRIT: 28.6 % — AB (ref 36.0–46.0)
HEMOGLOBIN: 9.3 g/dL — AB (ref 12.0–15.0)
MCH: 32.4 pg (ref 26.0–34.0)
MCHC: 32.5 g/dL (ref 30.0–36.0)
MCV: 99.7 fL (ref 78.0–100.0)
Platelets: 288 10*3/uL (ref 150–400)
RBC: 2.87 MIL/uL — AB (ref 3.87–5.11)
RDW: 15.1 % (ref 11.5–15.5)
WBC: 9.5 10*3/uL (ref 4.0–10.5)

## 2016-02-09 MED ORDER — ACETAMINOPHEN 325 MG PO TABS
650.0000 mg | ORAL_TABLET | Freq: Four times a day (QID) | ORAL | Status: AC | PRN
  Administered 2016-02-10: 650 mg via ORAL
  Filled 2016-02-09: qty 2

## 2016-02-09 MED ORDER — TRAMADOL HCL 50 MG PO TABS
50.0000 mg | ORAL_TABLET | Freq: Four times a day (QID) | ORAL | Status: DC | PRN
Start: 2016-02-09 — End: 2016-02-10
  Administered 2016-02-09 – 2016-02-10 (×2): 50 mg via ORAL
  Filled 2016-02-09 (×2): qty 1

## 2016-02-09 NOTE — Progress Notes (Signed)
Pt ambulating around the unit x2,tolerated well.

## 2016-02-09 NOTE — Progress Notes (Signed)
2 Days Post-Op Robotic LAR for diverticular stricture Subjective: Had a BM, passing flatus.  Not using much narcotics.  Had one episode of dizziness.    Objective: Vital signs in last 24 hours: Temp:  [98.3 F (36.8 C)-99.3 F (37.4 C)] 98.6 F (37 C) (11/05 0455) Pulse Rate:  [63-74] 67 (11/05 0455) Resp:  [15-16] 16 (11/05 0455) BP: (94-111)/(30-62) 94/54 (11/05 0455) SpO2:  [98 %-100 %] 98 % (11/05 0455)   Intake/Output from previous day: 11/04 0701 - 11/05 0700 In: 600 [P.O.:600] Out: 3800 [Urine:3800] Intake/Output this shift: Total I/O In: -  Out: 200 [Urine:200]  General appearance: alert and cooperative GI: soft, non-distended  Incision: no significant drainage  Lab Results:   Recent Labs  02/08/16 0505 02/09/16 0425  WBC 7.7 9.5  HGB 9.3* 9.3*  HCT 28.9* 28.6*  PLT 288 288   BMET  Recent Labs  02/08/16 0505 02/09/16 0425  NA 136 137  K 4.3 4.3  CL 103 104  CO2 26 28  GLUCOSE 123* 99  BUN 6 6  CREATININE 0.57 0.54  CALCIUM 8.7* 8.7*   PT/INR No results for input(s): LABPROT, INR in the last 72 hours. ABG No results for input(s): PHART, HCO3 in the last 72 hours.  Invalid input(s): PCO2, PO2  MEDS, Scheduled . enoxaparin (LOVENOX) injection  40 mg Subcutaneous Q24H  . loratadine  10 mg Oral Daily    Studies/Results: No results found.  Assessment: s/p Procedure(s): XI ROBOT LOW ANTERIOR RESECTION WITH RIGID PROCTOSCOPY AND SPLENIC FLEXURE MOBILIZATION CYSTOSCOPY WITH LEFT STENT PLACEMENT WITH INJECTION OF ICG DYE IN RIGHT URETER Patient Active Problem List   Diagnosis Date Noted  . Diverticular disease 02/07/2016  . Intra-abdominal abscess (HCC) 11/03/2015    Expected post op course  Plan: Advance diet to soft foods Ambulate Ambien at night for sleep Tramadol and/or tylenol PRN Possible d/c in AM   LOS: 2 days     .Vanita PandaAlicia C Kinney Sackmann, MD [facility], [place] [phone]   02/09/2016 8:15 AM

## 2016-02-10 LAB — BASIC METABOLIC PANEL WITH GFR
Anion gap: 9 (ref 5–15)
BUN: 6 mg/dL (ref 6–20)
CO2: 25 mmol/L (ref 22–32)
Calcium: 9 mg/dL (ref 8.9–10.3)
Chloride: 101 mmol/L (ref 101–111)
Creatinine, Ser: 0.54 mg/dL (ref 0.44–1.00)
GFR calc Af Amer: >60 mL/min
GFR calc non Af Amer: >60 mL/min
Glucose, Bld: 101 mg/dL — ABNORMAL HIGH (ref 65–99)
Potassium: 3.4 mmol/L — ABNORMAL LOW (ref 3.5–5.1)
Sodium: 135 mmol/L (ref 135–145)

## 2016-02-10 LAB — CBC
HCT: 27.4 % — ABNORMAL LOW (ref 36.0–46.0)
Hemoglobin: 9.2 g/dL — ABNORMAL LOW (ref 12.0–15.0)
MCH: 31.9 pg (ref 26.0–34.0)
MCHC: 33.6 g/dL (ref 30.0–36.0)
MCV: 95.1 fL (ref 78.0–100.0)
Platelets: 311 K/uL (ref 150–400)
RBC: 2.88 MIL/uL — ABNORMAL LOW (ref 3.87–5.11)
RDW: 14.6 % (ref 11.5–15.5)
WBC: 8.1 K/uL (ref 4.0–10.5)

## 2016-02-10 MED ORDER — TRAMADOL HCL 50 MG PO TABS: 50.0000 mg | ORAL_TABLET | Freq: Four times a day (QID) | ORAL | 0 refills | Status: AC | PRN

## 2016-02-10 NOTE — Progress Notes (Signed)
Discharge instructions and prescriptions given to patient .  Questions answered 

## 2016-02-10 NOTE — Discharge Summary (Signed)
Physician Discharge Summary  Patient ID: Donna RobsonWanda S Garrett MRN: [id number] DOB/AGE: [birth year] 65 y.o.  Admit date: 02/07/2016 Discharge date: 02/10/2016  Admission Diagnoses: Complicated diverticulitis  Discharge Diagnoses:  Active Problems:   Diverticular disease Diverticular stricture  Discharged Condition: good  Hospital Course: Pt was admitted after surgery. She began to have bowel function on POD 1.  Her diet was advanced as tolerated.  Her foley was removed by POD 2.  She was discharged on POD 3 once tolerating a diet and PO narcotics.    Consults: None  Significant Diagnostic Studies: labs: cbc, chemistry  Treatments: IV hydration, analgesia: tramadol and surgery: robotic LAR  Discharge Exam: Blood pressure 136/65, pulse 76, temperature 98.6 F (37 C), temperature source Oral, resp. rate 15, height 5\' 1"  (1.549 m), weight 57.6 kg (127 lb), SpO2 100 %. General appearance: alert and cooperative GI: soft, non-distended Incision/Wound: clean, dry, intact  Disposition: 01-Home or Self Care     Medication List    STOP taking these medications   amoxicillin-clavulanate 875-125 MG tablet Commonly known as:  AUGMENTIN   metroNIDAZOLE 500 MG tablet Commonly known as:  FLAGYL   neomycin 500 MG tablet Commonly known as:  MYCIFRADIN     TAKE these medications   acetaminophen 500 MG tablet Commonly known as:  TYLENOL Take 500-1,000 mg by mouth every 6 (six) hours as needed for mild pain or headache.   cetirizine 10 MG tablet Commonly known as:  ZYRTEC Take 10 mg by mouth daily.   diphenhydrAMINE 25 mg capsule Commonly known as:  BENADRYL Take 25 mg by mouth at bedtime as needed (for allergies.).   traMADol 50 MG tablet Commonly known as:  ULTRAM Take 1-2 tablets (50-100 mg total) by mouth every 6 (six) hours as needed for moderate pain or severe pain.   Turmeric 500 MG Caps Take 500 mg by mouth daily at 2 PM.   Vitamin D3 2000 units Tabs Take 2,000 Units by  mouth daily at 2 PM.      Follow-up Information    Vanita PandaHOMAS, Amiee Wiley C., MD Follow up in 2 week(s).   Specialty:  General Surgery Contact information: [address] [place] [address] [phone]           Signed: Vanita PandaHOMAS, Tanvi Gatling C. 02/10/2016, 8:25 AM

## 2016-02-10 NOTE — Discharge Instructions (Signed)

## 2016-04-09 ENCOUNTER — Encounter: Payer: Self-pay | Admitting: Radiology

## 2017-12-29 IMAGING — CT CT IMAGE GUIDED DRAINAGE BY PERCUTANEOUS CATHETER
1 of 10 series · 11 of 32 positions shown, 17 images · non-contrast
Comparison: CT abdomen pelvis - 11/01/2015

INDICATION: History of perforated diverticulitis. Please perform CT-guided drain
placement for infection source control.

EXAM:
CT IMAGE GUIDED DRAINAGE BY PERCUTANEOUS CATHETER

[Series 2: rtn a/p w/o · axial · non-contrast · 0.74mm/px · z∈[-184,-44]mm · 11 of 34 slices shown, 17 images]
[im 3/34  soft-tissue]
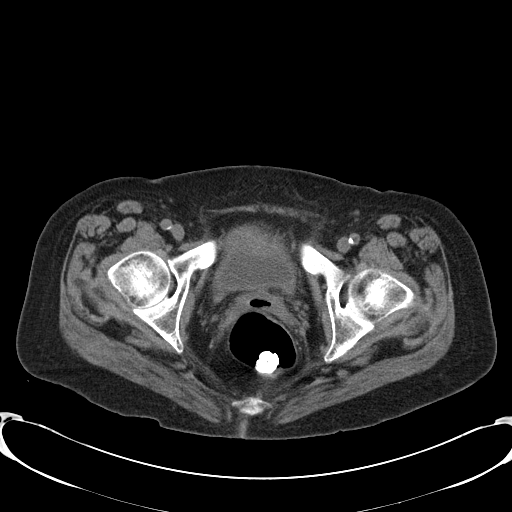
[im 3/34  bone]
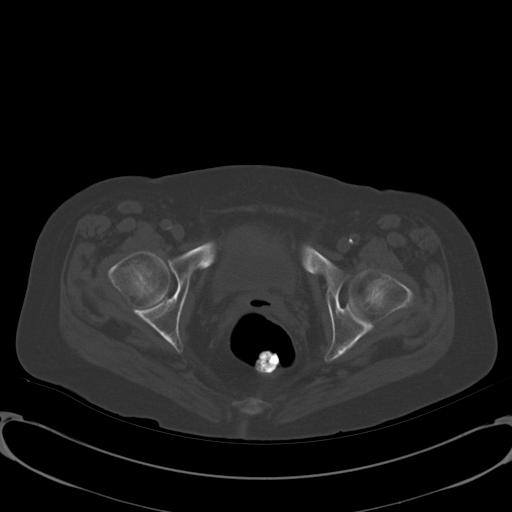
[im 6/34  soft-tissue]
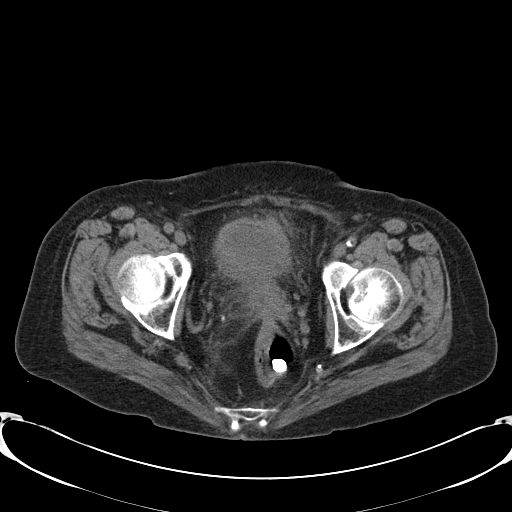
[im 8/34  soft-tissue]
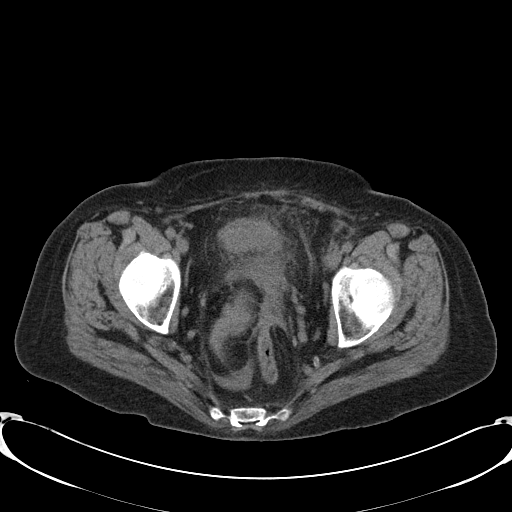
[im 11/34  soft-tissue]
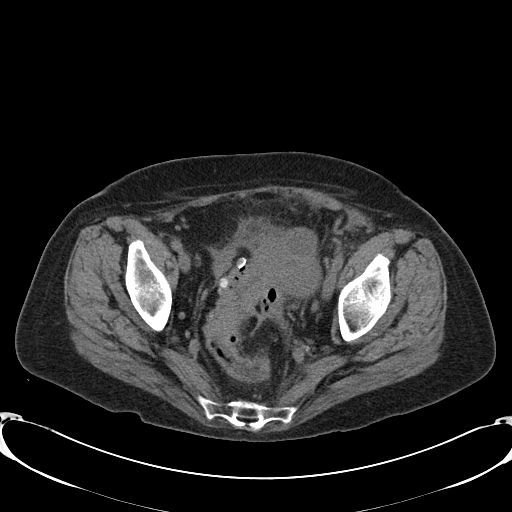
[im 13/34  soft-tissue]
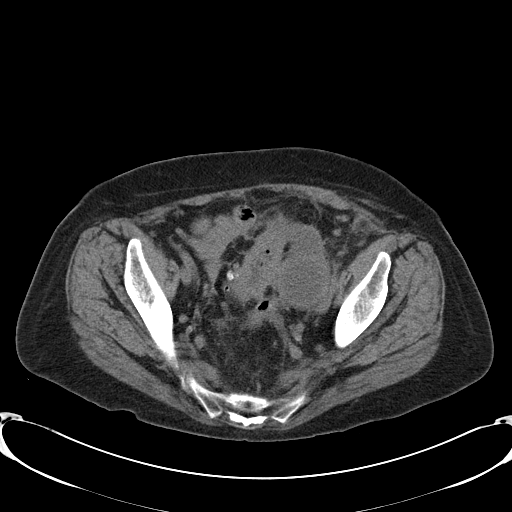
[im 18/34  soft-tissue]
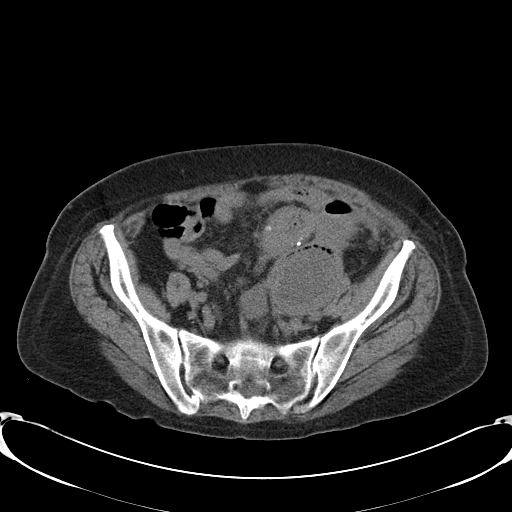
[im 21/34  soft-tissue]
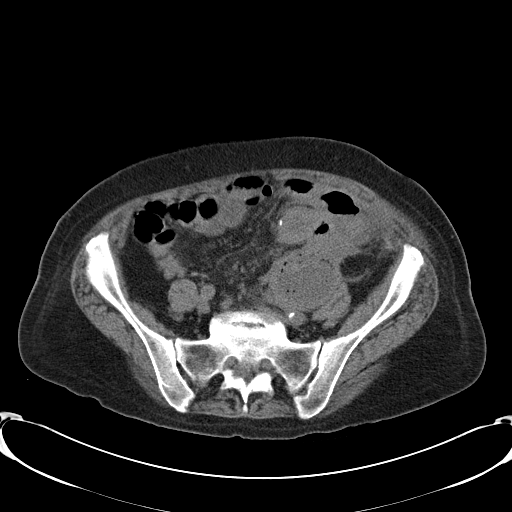
[im 23/34  soft-tissue]
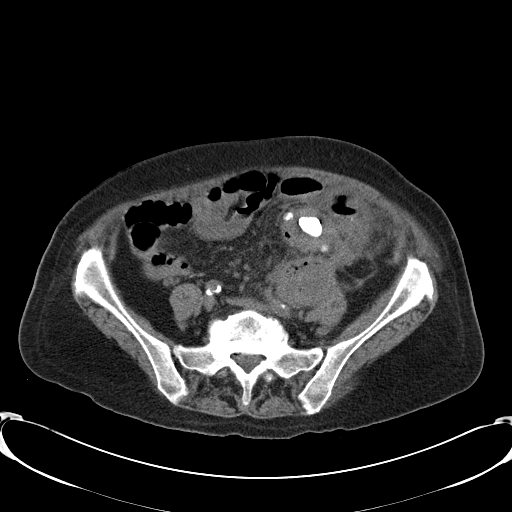
[im 23/34  lung]
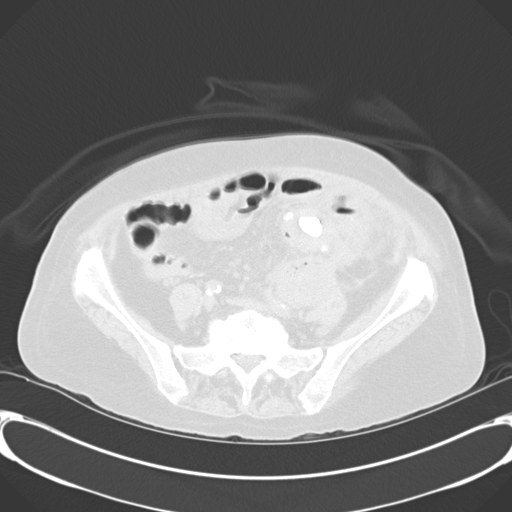
[im 26/34  soft-tissue]
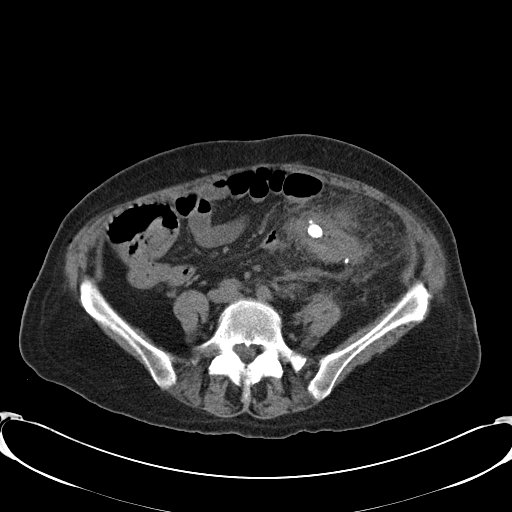
[im 26/34  lung]
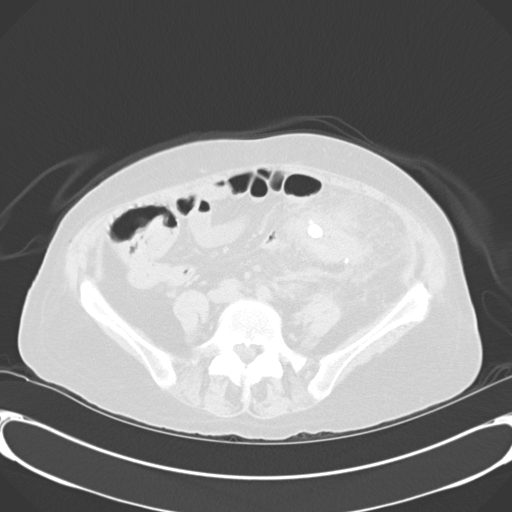
[im 26/34  bone]
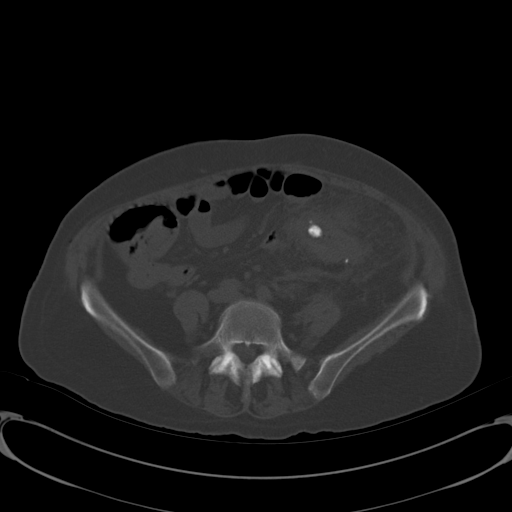
[im 28/34  soft-tissue]
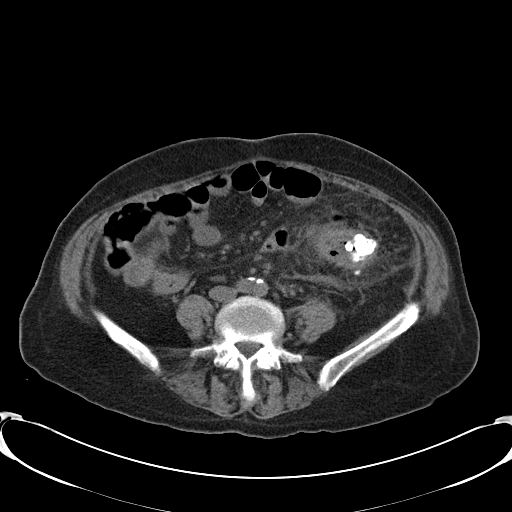
[im 28/34  lung]
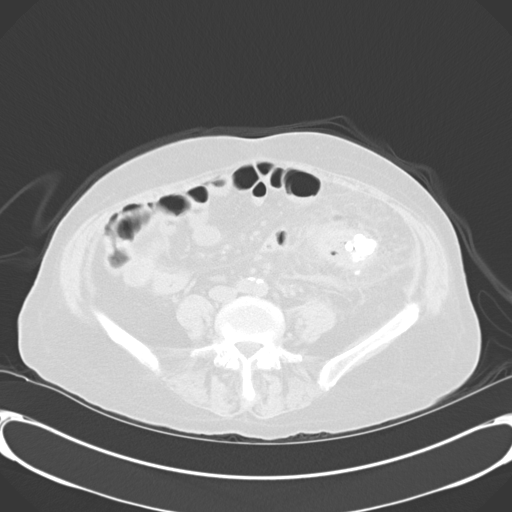
[im 31/34  soft-tissue]
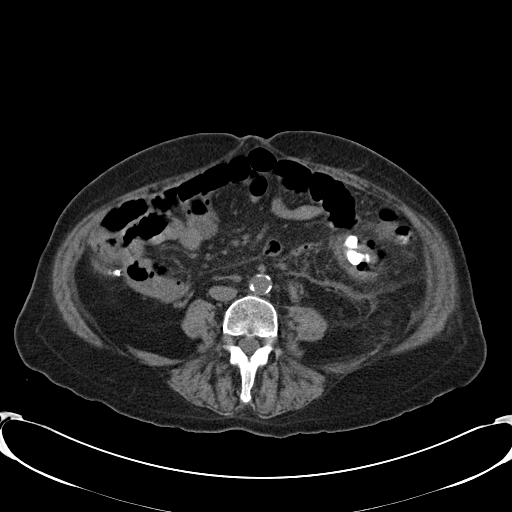
[im 31/34  lung]
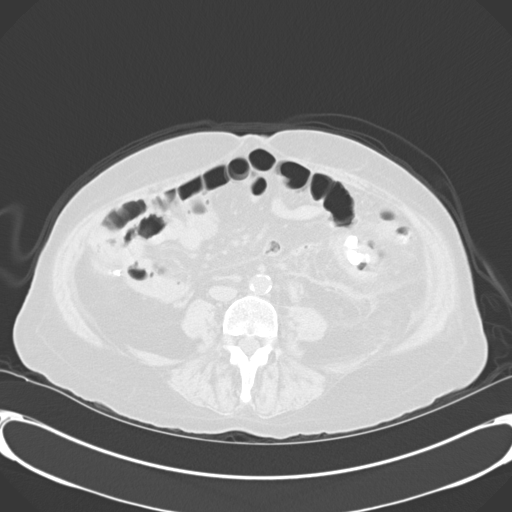

[11 of 32 positions shown; findings below may reference images not displayed]

MEDICATIONS:
The patient is currently admitted to the hospital and receiving
intravenous antibiotics. The antibiotics were administered within an
appropriate time frame prior to the initiation of the procedure.

ANESTHESIA/SEDATION:
Moderate (conscious) sedation was employed during this procedure. A
total of Versed 2 mg and Fentanyl 100 mcg was administered
intravenously.

Moderate Sedation Time: 25 minutes. The patient's level of
consciousness and vital signs were monitored continuously by
radiology nursing throughout the procedure under my direct
supervision.

CONTRAST:  None

COMPLICATIONS:
None immediate.

PROCEDURE:
Informed written consent was obtained from the patient after a
discussion of the risks, benefits and alternatives to treatment. The
patient was placed supine on the CT gantry and a pre procedural CT
was performed re-demonstrating the known bilobed abscess/fluid
collections within the left lower abdominal quadrant with dominant
the deep component measuring approximately 4.9 x 6.1 cm (image 16,
series 2) and more superficial air in fluid contain though less
well-defined component measuring approximately 4.4 x 2.9 cm (image
15). The procedure was planned. A timeout was performed prior to the
initiation of the procedure.

The skin overlying the left lower lateral abdomen was prepped and
draped in the usual sterile fashion. The overlying soft tissues were
anesthetized with 1% lidocaine with epinephrine. Appropriate
trajectory was planned with the use of a 22 gauge spinal needle. An
18 gauge trocar needle was advanced through the superficial
component of the bilobed abscess with tip terminating within the
deeper component of the complex fluid collection. Appropriate
positioning was confirmed and a short Amplatz super stiff wire was
coiled within the collection. Appropriate positioning was confirmed
with a limited CT scan. The tract was serially dilated allowing
placement of a 12 French biliary drainage multipurpose catheter.
Appropriate positioning was confirmed with sideholes of the drainage
catheter extending from the deeper component to the peripheral
aspect of the superficial component with a limited postprocedural CT
scan.

Following drainage catheter placement, approximately 80 Ml of
purulent, foul smelling fluid was aspirated. The tube was connected
to a drainage bag and sutured in place. A dressing was placed. The
patient tolerated the procedure well without immediate post
procedural complication.
IMPRESSION: Successful CT guided placement of a 12 French biliary drainage drain
catheter into the bilobed abscess within the left lower abdominal
quadrant with sideholes extending from the deep component to the
peripheral aspect of the superficial component ultimately yielding
aspiration of approximately 80 mL of purulent fluid. Samples were
sent to the laboratory as requested by the ordering clinical team.
